# Patient Record
Sex: Female | Born: 1945 | Race: White | Hispanic: No | Smoking: Current every day smoker
Health system: Southern US, Community
[De-identification: ages and names within clinical notes are randomized; demographics above are authoritative.]

## PROBLEM LIST (undated history)

## (undated) DIAGNOSIS — T8859XA Other complications of anesthesia, initial encounter: Secondary | ICD-10-CM

## (undated) DIAGNOSIS — I10 Essential (primary) hypertension: Secondary | ICD-10-CM

## (undated) DIAGNOSIS — F17200 Nicotine dependence, unspecified, uncomplicated: Secondary | ICD-10-CM

## (undated) DIAGNOSIS — T4145XA Adverse effect of unspecified anesthetic, initial encounter: Secondary | ICD-10-CM

## (undated) DIAGNOSIS — E785 Hyperlipidemia, unspecified: Principal | ICD-10-CM

## (undated) DIAGNOSIS — E039 Hypothyroidism, unspecified: Secondary | ICD-10-CM

## (undated) DIAGNOSIS — G43909 Migraine, unspecified, not intractable, without status migrainosus: Secondary | ICD-10-CM

## (undated) DIAGNOSIS — M75101 Unspecified rotator cuff tear or rupture of right shoulder, not specified as traumatic: Secondary | ICD-10-CM

## (undated) DIAGNOSIS — M549 Dorsalgia, unspecified: Secondary | ICD-10-CM

## (undated) DIAGNOSIS — K219 Gastro-esophageal reflux disease without esophagitis: Secondary | ICD-10-CM

## (undated) DIAGNOSIS — C801 Malignant (primary) neoplasm, unspecified: Secondary | ICD-10-CM

## (undated) DIAGNOSIS — L989 Disorder of the skin and subcutaneous tissue, unspecified: Secondary | ICD-10-CM

## (undated) DIAGNOSIS — Z720 Tobacco use: Secondary | ICD-10-CM

## (undated) DIAGNOSIS — M545 Low back pain: Secondary | ICD-10-CM

## (undated) HISTORY — PX: TONSILLECTOMY: SUR1361

## (undated) HISTORY — DX: Hyperlipidemia, unspecified: E78.5

## (undated) HISTORY — DX: Low back pain: M54.5

## (undated) HISTORY — DX: Nicotine dependence, unspecified, uncomplicated: F17.200

## (undated) HISTORY — DX: Disorder of the skin and subcutaneous tissue, unspecified: L98.9

## (undated) HISTORY — PX: BACK SURGERY: SHX140

## (undated) HISTORY — PX: CHOLECYSTECTOMY: SHX55

## (undated) HISTORY — DX: Essential (primary) hypertension: I10

## (undated) HISTORY — PX: COSMETIC SURGERY: SHX468

## (undated) HISTORY — PX: OTHER SURGICAL HISTORY: SHX169

## (undated) HISTORY — DX: Tobacco use: Z72.0

## (undated) HISTORY — PX: LAMINECTOMY: SHX219

## (undated) HISTORY — DX: Dorsalgia, unspecified: M54.9

## (undated) HISTORY — DX: Migraine, unspecified, not intractable, without status migrainosus: G43.909

---

## 1995-11-02 HISTORY — PX: OTHER SURGICAL HISTORY: SHX169

## 2013-03-29 NOTE — Progress Notes (Signed)
Need orders in EPIC please - pt coming for PST on THURS 04/05/13 - thank you

## 2013-04-02 ENCOUNTER — Encounter (HOSPITAL_COMMUNITY): Payer: Self-pay | Admitting: Pharmacy Technician

## 2013-04-02 NOTE — Progress Notes (Signed)
Need orders in EPIC for preop 04/05/13.  Surgery  04/11/13.  Thanks.

## 2013-04-05 ENCOUNTER — Other Ambulatory Visit (HOSPITAL_COMMUNITY): Payer: Self-pay | Admitting: Orthopedic Surgery

## 2013-04-05 ENCOUNTER — Encounter (HOSPITAL_COMMUNITY)
Admission: RE | Admit: 2013-04-05 | Discharge: 2013-04-05 | Disposition: A | Discharge status: Home / Self Care | Payer: Medicare Other | Source: Ambulatory Visit | Attending: Orthopedic Surgery | Admitting: Orthopedic Surgery

## 2013-04-05 ENCOUNTER — Encounter (HOSPITAL_COMMUNITY): Payer: Self-pay

## 2013-04-05 HISTORY — DX: Gastro-esophageal reflux disease without esophagitis: K21.9

## 2013-04-05 HISTORY — DX: Unspecified rotator cuff tear or rupture of right shoulder, not specified as traumatic: M75.101

## 2013-04-05 HISTORY — DX: Hypothyroidism, unspecified: E03.9

## 2013-04-05 HISTORY — DX: Malignant (primary) neoplasm, unspecified: C80.1

## 2013-04-05 LAB — COMPREHENSIVE METABOLIC PANEL
ALT: 21 U/L (ref 0–35)
AST: 20 U/L (ref 0–37)
Albumin: 3.8 g/dL (ref 3.5–5.2)
Alkaline Phosphatase: 67 U/L (ref 39–117)
BUN: 12 mg/dL (ref 6–23)
CO2: 28 mEq/L (ref 19–32)
Calcium: 9.5 mg/dL (ref 8.4–10.5)
Chloride: 101 mEq/L (ref 96–112)
Creatinine, Ser: 0.66 mg/dL (ref 0.50–1.10)
GFR calc Af Amer: >90 mL/min (ref >90)
GFR calc non Af Amer: >90 mL/min (ref >90)
Glucose, Bld: 86 mg/dL (ref 70–99)
Potassium: 3.7 mEq/L (ref 3.5–5.1)
Sodium: 137 mEq/L (ref 135–145)
Total Bilirubin: 0.5 mg/dL (ref 0.3–1.2)
Total Protein: 7.2 g/dL (ref 6.0–8.3)

## 2013-04-05 LAB — URINE MICROSCOPIC-ADD ON

## 2013-04-05 LAB — URINALYSIS, ROUTINE W REFLEX MICROSCOPIC
Bilirubin Urine: NEGATIVE
Glucose, UA: NEGATIVE mg/dL
Ketones, ur: NEGATIVE mg/dL
Leukocytes, UA: NEGATIVE
Nitrite: NEGATIVE
Protein, ur: NEGATIVE mg/dL
Specific Gravity, Urine: 1.013 (ref 1.005–1.030)
Urobilinogen, UA: 0.2 mg/dL (ref 0.0–1.0)
pH: 5 (ref 5.0–8.0)

## 2013-04-05 LAB — CBC
Platelets: 256 10*3/uL (ref 150–400)
RBC: 4.58 MIL/uL (ref 3.87–5.11)
RDW: 13.7 % (ref 11.5–15.5)
WBC: 8.1 10*3/uL (ref 4.0–10.5)

## 2013-04-05 LAB — PROTIME-INR
INR: 1.03 (ref 0.00–1.49)
Prothrombin Time: 13.4 seconds (ref 11.6–15.2)

## 2013-04-05 LAB — APTT: aPTT: 31 seconds (ref 24–37)

## 2013-04-05 LAB — SURGICAL PCR SCREEN: MRSA, PCR: NEGATIVE

## 2013-04-05 NOTE — Patient Instructions (Addendum)
20 Wren Pryce  04/05/2013   Your procedure is scheduled on: 04-11-2013  Report to [facility] at 1100 AM.  Call this number if you have problems the morning of surgery [phone]   Remember:   Do not eat food :After Midnight.   clear liquids midnight until 730 am, then nothing by mouth   Take these medicines the morning of surgery with A SIP OF WATER: synthroid                                SEE [place] PREPARING FOR SURGERY SHEET   Do not wear jewelry, make-up or nail polish.  Do not wear lotions, powders, or perfumes. You may wear deodorant.   Men may shave face and neck.  Do not bring valuables to the hospital.  Contacts, dentures or bridgework may not be worn into surgery.  Leave suitcase in the car. After surgery it may be brought to your room.  For patients admitted to the hospital, checkout time is 11:00 AM the day of discharge.   Patients discharged the day of surgery will not be allowed to drive home.  Name and phone number of your driver:  Special Instructions: N/A   Please read over the following fact sheets that you were given: MRSA Information.  Call Cain Sieve RN pre op nurse if needed [phone]    FAILURE TO FOLLOW THESE INSTRUCTIONS MAY RESULT IN THE CANCELLATION OF YOUR SURGERY. PATIENT SIGNATURE___________________________________________

## 2013-04-05 NOTE — Progress Notes (Signed)
Micro UA results sent to Dr. Darrelyn Hillock via epic

## 2013-04-11 ENCOUNTER — Encounter (HOSPITAL_COMMUNITY)
Admission: RE | Disposition: A | Discharge status: Home / Self Care | Payer: Self-pay | Source: Ambulatory Visit | Attending: Orthopedic Surgery

## 2013-04-11 ENCOUNTER — Ambulatory Visit (HOSPITAL_COMMUNITY): Payer: Medicare Other | Admitting: Anesthesiology

## 2013-04-11 ENCOUNTER — Observation Stay (HOSPITAL_COMMUNITY)
Admission: RE | Admit: 2013-04-11 | Discharge: 2013-04-12 | Disposition: A | Discharge status: Home / Self Care | Payer: Medicare Other | Source: Ambulatory Visit | Attending: Orthopedic Surgery | Admitting: Orthopedic Surgery

## 2013-04-11 ENCOUNTER — Encounter (HOSPITAL_COMMUNITY): Payer: Self-pay | Admitting: Anesthesiology

## 2013-04-11 ENCOUNTER — Encounter (HOSPITAL_COMMUNITY): Payer: Self-pay | Admitting: *Deleted

## 2013-04-11 DIAGNOSIS — F17200 Nicotine dependence, unspecified, uncomplicated: Secondary | ICD-10-CM | POA: Insufficient documentation

## 2013-04-11 DIAGNOSIS — Z79899 Other long term (current) drug therapy: Secondary | ICD-10-CM | POA: Insufficient documentation

## 2013-04-11 DIAGNOSIS — Z7982 Long term (current) use of aspirin: Secondary | ICD-10-CM | POA: Insufficient documentation

## 2013-04-11 DIAGNOSIS — M75120 Complete rotator cuff tear or rupture of unspecified shoulder, not specified as traumatic: Principal | ICD-10-CM | POA: Insufficient documentation

## 2013-04-11 DIAGNOSIS — E039 Hypothyroidism, unspecified: Secondary | ICD-10-CM | POA: Insufficient documentation

## 2013-04-11 DIAGNOSIS — Z01812 Encounter for preprocedural laboratory examination: Secondary | ICD-10-CM | POA: Insufficient documentation

## 2013-04-11 DIAGNOSIS — K219 Gastro-esophageal reflux disease without esophagitis: Secondary | ICD-10-CM | POA: Insufficient documentation

## 2013-04-11 DIAGNOSIS — Z01810 Encounter for preprocedural cardiovascular examination: Secondary | ICD-10-CM | POA: Insufficient documentation

## 2013-04-11 DIAGNOSIS — M25819 Other specified joint disorders, unspecified shoulder: Secondary | ICD-10-CM | POA: Insufficient documentation

## 2013-04-11 DIAGNOSIS — Z85828 Personal history of other malignant neoplasm of skin: Secondary | ICD-10-CM | POA: Insufficient documentation

## 2013-04-11 DIAGNOSIS — M75101 Unspecified rotator cuff tear or rupture of right shoulder, not specified as traumatic: Secondary | ICD-10-CM

## 2013-04-11 DIAGNOSIS — M75100 Unspecified rotator cuff tear or rupture of unspecified shoulder, not specified as traumatic: Secondary | ICD-10-CM | POA: Diagnosis present

## 2013-04-11 DIAGNOSIS — IMO0002 Reserved for concepts with insufficient information to code with codable children: Secondary | ICD-10-CM | POA: Insufficient documentation

## 2013-04-11 DIAGNOSIS — E7800 Pure hypercholesterolemia, unspecified: Secondary | ICD-10-CM | POA: Insufficient documentation

## 2013-04-11 HISTORY — PX: SHOULDER OPEN ROTATOR CUFF REPAIR: SHX2407

## 2013-04-11 SURGERY — REPAIR, ROTATOR CUFF, OPEN
Anesthesia: General | Site: Shoulder | Laterality: Right | Wound class: Clean

## 2013-04-11 MED ORDER — DEXAMETHASONE SODIUM PHOSPHATE 10 MG/ML IJ SOLN
INTRAMUSCULAR | Status: DC | PRN
  Administered 2013-04-11: 10 mg via INTRAVENOUS

## 2013-04-11 MED ORDER — FLEET ENEMA 7-19 GM/118ML RE ENEM: 1.0000 | ENEMA | Freq: Once | RECTAL | Status: AC | PRN

## 2013-04-11 MED ORDER — PHENYLEPHRINE HCL 10 MG/ML IJ SOLN
INTRAMUSCULAR | Status: DC | PRN
  Administered 2013-04-11 (×4): 40 ug via INTRAVENOUS

## 2013-04-11 MED ORDER — SUCCINYLCHOLINE CHLORIDE 20 MG/ML IJ SOLN
INTRAMUSCULAR | Status: DC | PRN
  Administered 2013-04-11: 100 mg via INTRAVENOUS

## 2013-04-11 MED ORDER — HYDROMORPHONE HCL PF 1 MG/ML IJ SOLN: 0.5000 mg | INTRAMUSCULAR | Status: DC | PRN

## 2013-04-11 MED ORDER — METHOCARBAMOL 500 MG PO TABS
500.0000 mg | ORAL_TABLET | Freq: Four times a day (QID) | ORAL | Status: DC | PRN
  Administered 2013-04-11: 500 mg via ORAL
  Filled 2013-04-11 (×2): qty 1

## 2013-04-11 MED ORDER — ACETAMINOPHEN 325 MG PO TABS: 650.0000 mg | ORAL_TABLET | Freq: Four times a day (QID) | ORAL | Status: DC | PRN

## 2013-04-11 MED ORDER — SODIUM CHLORIDE 0.9 % IR SOLN
Status: DC | PRN
  Administered 2013-04-11: 14:00:00

## 2013-04-11 MED ORDER — MENTHOL 3 MG MT LOZG: 1.0000 | LOZENGE | OROMUCOSAL | Status: DC | PRN

## 2013-04-11 MED ORDER — THROMBIN 5000 UNITS EX SOLR
CUTANEOUS | Status: AC
  Filled 2013-04-11: qty 10000

## 2013-04-11 MED ORDER — MIDAZOLAM HCL 5 MG/5ML IJ SOLN
INTRAMUSCULAR | Status: DC | PRN
  Administered 2013-04-11: 2 mg via INTRAVENOUS

## 2013-04-11 MED ORDER — PROPOFOL 10 MG/ML IV BOLUS
INTRAVENOUS | Status: DC | PRN
  Administered 2013-04-11: 100 mg via INTRAVENOUS
  Administered 2013-04-11: 30 mg via INTRAVENOUS

## 2013-04-11 MED ORDER — BISACODYL 10 MG RE SUPP: 10.0000 mg | Freq: Every day | RECTAL | Status: DC | PRN

## 2013-04-11 MED ORDER — HYDROMORPHONE HCL PF 1 MG/ML IJ SOLN
0.2500 mg | INTRAMUSCULAR | Status: DC | PRN
  Administered 2013-04-11 (×3): 0.5 mg via INTRAVENOUS

## 2013-04-11 MED ORDER — OXYCODONE-ACETAMINOPHEN 5-325 MG PO TABS
1.0000 | ORAL_TABLET | ORAL | Status: DC | PRN
  Administered 2013-04-11 – 2013-04-12 (×3): 1 via ORAL
  Filled 2013-04-11 (×4): qty 1

## 2013-04-11 MED ORDER — HYDROMORPHONE HCL PF 1 MG/ML IJ SOLN
INTRAMUSCULAR | Status: AC
  Filled 2013-04-11: qty 1

## 2013-04-11 MED ORDER — ONDANSETRON HCL 4 MG/2ML IJ SOLN
INTRAMUSCULAR | Status: DC | PRN
  Administered 2013-04-11: 4 mg via INTRAVENOUS

## 2013-04-11 MED ORDER — LACTATED RINGERS IV SOLN
INTRAVENOUS | Status: DC
  Administered 2013-04-11: 13:00:00 via INTRAVENOUS

## 2013-04-11 MED ORDER — FENTANYL CITRATE 0.05 MG/ML IJ SOLN
INTRAMUSCULAR | Status: DC | PRN
  Administered 2013-04-11: 100 ug via INTRAVENOUS
  Administered 2013-04-11 (×3): 50 ug via INTRAVENOUS

## 2013-04-11 MED ORDER — BUPIVACAINE LIPOSOME 1.3 % IJ SUSP
INTRAMUSCULAR | Status: DC | PRN
  Administered 2013-04-11: 20 mL

## 2013-04-11 MED ORDER — ONDANSETRON HCL 4 MG PO TABS: 4.0000 mg | ORAL_TABLET | Freq: Four times a day (QID) | ORAL | Status: DC | PRN

## 2013-04-11 MED ORDER — CLINDAMYCIN PHOSPHATE 900 MG/50ML IV SOLN
INTRAVENOUS | Status: AC
  Filled 2013-04-11: qty 50

## 2013-04-11 MED ORDER — PROMETHAZINE HCL 25 MG/ML IJ SOLN: 6.2500 mg | INTRAMUSCULAR | Status: DC | PRN

## 2013-04-11 MED ORDER — POLYETHYLENE GLYCOL 3350 17 G PO PACK: 17.0000 g | PACK | Freq: Every day | ORAL | Status: DC | PRN

## 2013-04-11 MED ORDER — ACETAMINOPHEN 650 MG RE SUPP: 650.0000 mg | Freq: Four times a day (QID) | RECTAL | Status: DC | PRN

## 2013-04-11 MED ORDER — BUPIVACAINE LIPOSOME 1.3 % IJ SUSP
20.0000 mL | Freq: Once | INTRAMUSCULAR | Status: DC
  Filled 2013-04-11: qty 20

## 2013-04-11 MED ORDER — PHENOL 1.4 % MT LIQD: 1.0000 | OROMUCOSAL | Status: DC | PRN

## 2013-04-11 MED ORDER — ATORVASTATIN CALCIUM 80 MG PO TABS
80.0000 mg | ORAL_TABLET | Freq: Every day | ORAL | Status: DC
  Administered 2013-04-11: 80 mg via ORAL
  Filled 2013-04-11 (×2): qty 1

## 2013-04-11 MED ORDER — CLINDAMYCIN PHOSPHATE 900 MG/50ML IV SOLN
900.0000 mg | INTRAVENOUS | Status: AC
  Administered 2013-04-11: 900 mg via INTRAVENOUS
  Filled 2013-04-11: qty 50

## 2013-04-11 MED ORDER — ASPIRIN EC 81 MG PO TBEC
81.0000 mg | DELAYED_RELEASE_TABLET | Freq: Every day | ORAL | Status: DC
  Administered 2013-04-11 – 2013-04-12 (×2): 81 mg via ORAL
  Filled 2013-04-11 (×2): qty 1

## 2013-04-11 MED ORDER — PANTOPRAZOLE SODIUM 20 MG PO TBEC
20.0000 mg | DELAYED_RELEASE_TABLET | Freq: Every day | ORAL | Status: DC
  Administered 2013-04-11: 20 mg via ORAL
  Filled 2013-04-11 (×2): qty 1

## 2013-04-11 MED ORDER — LEVOTHYROXINE SODIUM 50 MCG PO TABS
50.0000 ug | ORAL_TABLET | Freq: Every day | ORAL | Status: DC
  Administered 2013-04-12: 50 ug via ORAL
  Filled 2013-04-11 (×2): qty 1

## 2013-04-11 MED ORDER — LACTATED RINGERS IV SOLN: INTRAVENOUS | Status: DC

## 2013-04-11 MED ORDER — NICOTINE 7 MG/24HR TD PT24
7.0000 mg | MEDICATED_PATCH | Freq: Every day | TRANSDERMAL | Status: DC
Start: 2013-04-11 — End: 2013-04-11
  Administered 2013-04-11: 7 mg via TRANSDERMAL
  Filled 2013-04-11 (×2): qty 1

## 2013-04-11 MED ORDER — ONDANSETRON HCL 4 MG/2ML IJ SOLN: 4.0000 mg | Freq: Four times a day (QID) | INTRAMUSCULAR | Status: DC | PRN

## 2013-04-11 MED ORDER — METHOCARBAMOL 100 MG/ML IJ SOLN
500.0000 mg | Freq: Four times a day (QID) | INTRAMUSCULAR | Status: DC | PRN
  Administered 2013-04-11: 500 mg via INTRAVENOUS
  Filled 2013-04-11: qty 5

## 2013-04-11 MED ORDER — LACTATED RINGERS IV SOLN
INTRAVENOUS | Status: DC
  Administered 2013-04-11: 1000 mL via INTRAVENOUS

## 2013-04-11 MED ORDER — CLINDAMYCIN PHOSPHATE 600 MG/50ML IV SOLN
600.0000 mg | Freq: Four times a day (QID) | INTRAVENOUS | Status: AC
  Administered 2013-04-11 – 2013-04-12 (×3): 600 mg via INTRAVENOUS
  Filled 2013-04-11 (×3): qty 50

## 2013-04-11 MED ORDER — CEFAZOLIN SODIUM-DEXTROSE 2-3 GM-% IV SOLR
INTRAVENOUS | Status: AC
  Filled 2013-04-11: qty 50

## 2013-04-11 MED ORDER — LIDOCAINE HCL (CARDIAC) 20 MG/ML IV SOLN
INTRAVENOUS | Status: DC | PRN
  Administered 2013-04-11: 80 mg via INTRAVENOUS

## 2013-04-11 SURGICAL SUPPLY — 45 items
BAG ZIPLOCK 12X15 (MISCELLANEOUS) ×2 IMPLANT
BLADE OSCILLATING/SAGITTAL (BLADE) ×1
BLADE SW THK.38XMED LNG THN (BLADE) ×1 IMPLANT
BNDG COHESIVE 6X5 TAN NS LF (GAUZE/BANDAGES/DRESSINGS) IMPLANT
BUR OVAL CARBIDE 4.0 (BURR) ×2 IMPLANT
CLEANER TIP ELECTROSURG 2X2 (MISCELLANEOUS) ×2 IMPLANT
CLOTH BEACON ORANGE TIMEOUT ST (SAFETY) ×2 IMPLANT
DERMABOND ADVANCED (GAUZE/BANDAGES/DRESSINGS) ×1
DERMABOND ADVANCED .7 DNX12 (GAUZE/BANDAGES/DRESSINGS) ×1 IMPLANT
DRAPE POUCH INSTRU U-SHP 10X18 (DRAPES) ×2 IMPLANT
DRSG AQUACEL AG ADV 3.5X 6 (GAUZE/BANDAGES/DRESSINGS) ×2 IMPLANT
DRSG EMULSION OIL 3X3 NADH (GAUZE/BANDAGES/DRESSINGS) ×2 IMPLANT
DRSG PAD ABDOMINAL 8X10 ST (GAUZE/BANDAGES/DRESSINGS) ×4 IMPLANT
DURAPREP 26ML APPLICATOR (WOUND CARE) ×2 IMPLANT
ELECT REM PT RETURN 9FT ADLT (ELECTROSURGICAL) ×2
ELECTRODE REM PT RTRN 9FT ADLT (ELECTROSURGICAL) ×1 IMPLANT
GLOVE BIOGEL PI IND STRL 8 (GLOVE) ×1 IMPLANT
GLOVE BIOGEL PI INDICATOR 8 (GLOVE) ×1
GLOVE ECLIPSE 8.0 STRL XLNG CF (GLOVE) ×4 IMPLANT
GLOVE SURG SS PI 6.5 STRL IVOR (GLOVE) ×4 IMPLANT
GOWN PREVENTION PLUS LG XLONG (DISPOSABLE) ×4 IMPLANT
KIT BASIN OR (CUSTOM PROCEDURE TRAY) ×2 IMPLANT
MANIFOLD NEPTUNE II (INSTRUMENTS) ×2 IMPLANT
NEEDLE MA TROC 1/2 (NEEDLE) IMPLANT
NS IRRIG 1000ML POUR BTL (IV SOLUTION) IMPLANT
PACK SHOULDER CUSTOM OPM052 (CUSTOM PROCEDURE TRAY) ×2 IMPLANT
PASSER SUT SWANSON 36MM LOOP (INSTRUMENTS) IMPLANT
PATCH TISSUE MEND 3X3CM (Orthopedic Implant) ×2 IMPLANT
POSITIONER SURGICAL ARM (MISCELLANEOUS) ×2 IMPLANT
SLING ARM IMMOBILIZER LRG (SOFTGOODS) ×2 IMPLANT
SPONGE SURGIFOAM ABS GEL 100 (HEMOSTASIS) IMPLANT
STAPLER VISISTAT 35W (STAPLE) ×2 IMPLANT
STRIP CLOSURE SKIN 1/2X4 (GAUZE/BANDAGES/DRESSINGS) ×2 IMPLANT
SUCTION FRAZIER 12FR DISP (SUCTIONS) ×2 IMPLANT
SUT BONE WAX W31G (SUTURE) ×2 IMPLANT
SUT ETHIBOND NAB CT1 #1 30IN (SUTURE) ×2 IMPLANT
SUT MNCRL AB 4-0 PS2 18 (SUTURE) ×2 IMPLANT
SUT VIC AB 0 CT1 27 (SUTURE) ×1
SUT VIC AB 0 CT1 27XBRD ANTBC (SUTURE) ×1 IMPLANT
SUT VIC AB 1 CT1 27 (SUTURE) ×2
SUT VIC AB 1 CT1 27XBRD ANTBC (SUTURE) ×2 IMPLANT
SUT VIC AB 2-0 CT1 27 (SUTURE) ×2
SUT VIC AB 2-0 CT1 27XBRD (SUTURE) IMPLANT
SUT VIC AB 2-0 CT1 TAPERPNT 27 (SUTURE) ×2 IMPLANT
TOWEL OR 17X26 10 PK STRL BLUE (TOWEL DISPOSABLE) ×4 IMPLANT

## 2013-04-11 NOTE — H&P (Signed)
Carrie Drake DOB: Nov 20, 1945  Chief Complaint: right shoulder pain   History of Present Illness The patient is a 67 year old female who presents with shoulder complaints. They are right handed and presented reporting pain and throbbing at the right shoulder that began 3 months ago. The patient reports that the shoulder symptoms began without any known injury, other than recently moving here and states that she has been moving a lot of heavy furniture around. The patient reports symptoms which include shoulder pain, tenderness, night pain, inability to lay on that side, numbness and tingling in fingers depending on how much she uses her shoulder and neck pain. The patient reports that these symptoms are present constantly. The patient describes these symptoms as severe and worsening. Symptoms are exacerbated by motion at the shoulder and lifting. Symptoms are not relieved by application of ice. The patient was previously evaluated [facility]. Past evaluation has included shoulder MRI which revealed a tear of her right rotator cuff. She has had a cortisone injection with little relief.   Allergies Sulfa Drugs Penicillins Morphine Derivatives Tetracyclines   Family History Cancer. father Cerebrovascular Accident. father and brother Chronic Obstructive Lung Disease. father Congestive Heart Failure. father Diabetes Mellitus. father and brother Drug / Alcohol Addiction. father Heart Disease. mother, father and brother Heart disease in female family member before age 45 Hypertension. father Rheumatoid Arthritis. mother   Social History Alcohol use. current drinker; drinks wine; less than 5 per week Children. 2 Current work status. retired Exercise. Exercises weekly; does running / walking Living situation. live alone Marital status. divorced Number of flights of stairs before winded. greater than 5 Tobacco use. current every day smoker;  smoke(d) less than 1/2 pack(s) per day   Medication History Synthroid ( Tablet, Oral) Active. Aspirin Low Strength (81MG  Tablet Chewable, Oral) Active. Lipitor (80MG  Tablet, Oral) Active. Calcium Ascorbate (500MG  Tablet, Oral) Active.   Past Surgical History Breast Biopsy. left Foot Surgery. left Gallbladder Surgery. laporoscopic Neck Disc Surgery Spinal Decompression. lower back Spinal Fusion. neck Tonsillectomy   Past Medical History Allergic Urticaria Gastroesophageal Reflux Disease Hypercholesterolemia Hypothyroidism Migraine Headache Skin Cancer Ulcer disease   Review of Systems General:Not Present- Chills, Fever, Night Sweats, Appetite Loss, Fatigue, Feeling sick, Weight Gain and Weight Loss. Skin:Not Present- Itching, Rash, Skin Color Changes, Ulcer, Psoriasis and Change in Hair or Nails. HEENT:Not Present- Sensitivity to light, Hearing problems, Nose Bleed and Ringing in the Ears. Neck:Not Present- Swollen Glands and Neck Mass. Respiratory:Not Present- Snoring, Chronic Cough, Bloody sputum and Dyspnea. Cardiovascular:Present- Leg Cramps. Not Present- Shortness of Breath, Chest Pain, Swelling of Extremities and Palpitations. Gastrointestinal:Present- Heartburn. Not Present- Bloody Stool, Abdominal Pain, Vomiting, Nausea and Incontinence of Stool. Female Genitourinary:Not Present- Blood in Urine, Menstrual Irregularities, Frequency, Incontinence and Nocturia. Musculoskeletal:Present- Joint Stiffness, Joint Swelling, Joint Pain and Back Pain. Not Present- Muscle Weakness and Muscle Pain. Neurological:Not Present- Tingling, Numbness, Burning, Tremor, Headaches and Dizziness. Psychiatric:Not Present- Anxiety, Depression and Memory Loss. Endocrine:Not Present- Cold Intolerance, Heat Intolerance, Excessive hunger and Excessive Thirst. Hematology:Not Present- Abnormal Bleeding, Anemia, Blood Clots and Easy Bruising.   Vitals Weight: 123  lb Height: 58.5 in Body Surface Area: 1.52 m Body Mass Index: 25.27 kg/m Pulse: 89 (Regular) BP: 150/81 (Sitting, Left Arm, Standard)   Physical Exam Alert and oriented and in no acute distress. Heart sounds normal. RRR. No murmurs. Lungs clear to auscultation. Abdomen soft and nontender. Bowel sounds active. EOM intact. Neck supple, no bruit. On examination of her right shoulder  she is unable to abduct her shoulder beyond about 30 degrees. She had severe pain with abduction. She has no masses or lymphadenopathy in the axilla. There are no supraclavicular nodes. The [facility] joint is intact. The shoulder is well located. The shoulder function is limited in all ranges, due to her pain. There are no major neck issues. She has good circulation in her hand. Good function in her hand.    RADIOGRAPHS: Plain X-rays of her shoulder shows the humeral head is riding high in her glenoid.    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 She needs to have an open repair and possible graft as we described today. Possible use of graft and anchors. She will be in the hospital overnight. She is not permitted to eat or drink after midnight the night before. Possible complications are infection, etc, which are extremely rare.     Dimitri Ped, PA-C

## 2013-04-11 NOTE — Interval H&P Note (Signed)
History and Physical Interval Note:  04/11/2013 12:52 PM  Carrie Drake  has presented today for surgery, with the diagnosis of RIGHT TORN ROTATOR CUFF  The various methods of treatment have been discussed with the patient and family. After consideration of risks, benefits and other options for treatment, the patient has consented to  Procedure(s): RIGHT ROTATOR CUFF REPAIR SHOULDER OPEN (Right) as a surgical intervention .  The patient's history has been reviewed, patient examined, no change in status, stable for surgery.  I have reviewed the patient's chart and labs.  Questions were answered to the patient's satisfaction.     Djimon Lundstrom A

## 2013-04-11 NOTE — Transfer of Care (Signed)
Immediate Anesthesia Transfer of Care Note  Patient: Carrie Drake  Procedure(s) Performed: Procedure(s): RIGHT ROTATOR CUFF REPAIR SHOULDER OPEN, ACROMIONECTOMY w/ GRAFT (Right)  Patient Location: PACU  Anesthesia Type:General  Level of Consciousness: awake, alert , oriented and patient cooperative  Airway & Oxygen Therapy: Patient Spontanous Breathing and Patient connected to face mask oxygen  Post-op Assessment: Report given to PACU RN and Post -op Vital signs reviewed and stable  Post vital signs: stable  Complications: No apparent anesthesia complications

## 2013-04-11 NOTE — Anesthesia Preprocedure Evaluation (Addendum)
Anesthesia Evaluation  Patient identified by MRN, date of birth, ID band Patient awake    Reviewed: Allergy & Precautions, H&P , NPO status , Patient's Chart, lab work & pertinent test results  Airway Mallampati: II TM Distance: >3 FB Neck ROM: Full    Dental no notable dental hx.    Pulmonary Current Smoker,  breath sounds clear to auscultation  Pulmonary exam normal       Cardiovascular Exercise Tolerance: Good negative cardio ROS  Rhythm:Regular Rate:Normal     Neuro/Psych Neck, lower back, and shoulder pain.  Neuromuscular disease negative psych ROS   GI/Hepatic Neg liver ROS, GERD-  Medicated,  Endo/Other  Hypothyroidism   Renal/GU negative Renal ROS  negative genitourinary   Musculoskeletal negative musculoskeletal ROS (+)   Abdominal   Peds negative pediatric ROS (+)  Hematology negative hematology ROS (+)   Anesthesia Other Findings   Reproductive/Obstetrics negative OB ROS                          Anesthesia Physical Anesthesia Plan  ASA: II  Anesthesia Plan: General   Post-op Pain Management:    Induction: Intravenous  Airway Management Planned: Oral ETT  Additional Equipment:   Intra-op Plan:   Post-operative Plan: Extubation in OR  Informed Consent: I have reviewed the patients History and Physical, chart, labs and discussed the procedure including the risks, benefits and alternatives for the proposed anesthesia with the patient or authorized representative who has indicated his/her understanding and acceptance.   Dental advisory given  Plan Discussed with: CRNA  Anesthesia Plan Comments:         Anesthesia Quick Evaluation

## 2013-04-11 NOTE — Brief Op Note (Signed)
04/11/2013  2:37 PM  PATIENT:  Carrie Drake  67 y.o. female  PRE-OPERATIVE DIAGNOSIS:  RIGHT TORN ROTATOR CUFF  POST-OPERATIVE DIAGNOSIS:  RIGHT TORN ROTATOR CUFF  PROCEDURE:  Procedure(s): RIGHT ROTATOR CUFF REPAIR SHOULDER OPEN, ACROMIONECTOMY w/ GRAFT (Right)  SURGEON:  Surgeon(s) and Role:    * Jacki Cones, MD - Primary  PHYSICIAN ASSISTANT: Dimitri Ped PA  ASSISTANTS: Dimitri Ped PA   ANESTHESIA:   general  EBL:  Total I/O In: 1000 [I.V.:1000] Out: -   BLOOD ADMINISTERED:none  DRAINS: none   LOCAL MEDICATIONS USED:  BUPIVICAINE 20cc  SPECIMEN:  No Specimen  DISPOSITION OF SPECIMEN:  N/A  COUNTS:  YES  TOURNIQUET:  * No tourniquets in log *  DICTATION: .Other Dictation: Dictation Number [id number]  PLAN OF CARE: Admit for overnight observation  PATIENT DISPOSITION:  Stable In OR   Delay start of Pharmacological VTE agent (>24hrs) due to surgical blood loss or risk of bleeding: yes

## 2013-04-11 NOTE — H&P (View-Only) (Signed)
Need orders in EPIC for preop 04/05/13.  Surgery  04/11/13.  Thanks.  

## 2013-04-12 ENCOUNTER — Encounter (HOSPITAL_COMMUNITY): Payer: Self-pay | Admitting: Orthopedic Surgery

## 2013-04-12 MED ORDER — METHOCARBAMOL 500 MG PO TABS: 500.0000 mg | ORAL_TABLET | Freq: Four times a day (QID) | ORAL | Status: DC

## 2013-04-12 MED ORDER — OXYCODONE-ACETAMINOPHEN 10-325 MG PO TABS: 1.0000 | ORAL_TABLET | ORAL | Status: DC | PRN

## 2013-04-12 NOTE — Op Note (Signed)
NAMEYATZIRI, WAINWRIGHT            ACCOUNT NO.:  [id number]  MEDICAL RECORD NO.:  [id number]  LOCATION:  1617                         FACILITY:  [facility]  PHYSICIAN:  Georges Lynch. Frantz Quattrone, M.D.DATE OF BIRTH:  10-27-1946  DATE OF PROCEDURE:  04/11/2013 DATE OF DISCHARGE:                              OPERATIVE REPORT   SURGEON:  Georges Lynch. Zayan Delvecchio, M.D.  ASSISTANT:  Dimitri Ped, PA-C  PREOPERATIVE DIAGNOSES: 1. Severe impingement syndrome, right shoulder. 2. Torn rotator cuff tendon, right shoulder. 3. Subdeltoid bursitis.  POSTOPERATIVE DIAGNOSES: 1. Severe impingement syndrome, right shoulder. 2. Torn rotator cuff tendon, right shoulder. 3. Subdeltoid bursitis.  OPERATION: 1. Open acromionectomy and acromioplasty, right shoulder. 2. Repair of rotator cuff tendon, right shoulder utilizing a     TissueMend graft. 3. Bursectomy of the subdeltoid bursa, right shoulder.  PROCEDURE:  Under general anesthesia, routine orthopedic prep and drape was carried out with the patient in a beach chair position.  The appropriate time-out was first carried out.  I also marked the appropriate right arm in the holding area.  At this time, an incision was made over the acromion on the right shoulder which was identified and cauterized.  The incision was extended slightly distally into the deltoid muscle.  At this time, I stripped the deltoid tendon from the acromion in the usual fashion.  I then identified a severe impingement- type problem within the acromion.  I protected the underlying rotator cuff with the Bennett retractor, utilized the oscillating saw and burred a partial acromionectomy and acromioplasty.  I then thoroughly irrigated out the area.  I had bone waxed the undersurface of the acromion.  Note, the tendon literally was about 50% torn.  We just simply did a simple repair with utilizing a TissueMend graft.  No anchor was necessary. When we were finished, we made sure that we had a  good re-establishment of the subacromial space.  Thoroughly irrigated out the area, reapproximated the deltoid tendon muscle in usual fashion.  The subcu closure was carried out, utilizing 2-0 Monocryl.  Prior to closing the bone, I injected 20 mL of Exparel into the wound site.  Sterile dressings were applied.  The patient was placed in a sling.  She had Cleocin 900 mg.          ______________________________ Georges Lynch. Darrelyn Hillock, M.D.     RAG/MEDQ  D:  04/11/2013  T:  04/12/2013  Job:  [id number]

## 2013-04-12 NOTE — Evaluation (Signed)
Occupational Therapy Evaluation Patient Details Name: Carrie Drake MRN: [id number] DOB: 05-19-1946 Today's Date: 04/12/2013 Time: 4098-1191 and 1010-1015 OT Time Calculation (min): 53 min  OT Assessment / Plan / Recommendation Clinical Impression  This 67 year old female was admitted for R RCR.  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  Took photos of back of immobilizer.  She will follow up with Dr Darrelyn Hillock for further rehab.      OT Assessment  Patient does not need any further OT services    Follow Up Recommendations   (follow up with Dr Darrelyn Hillock)    Barriers to Discharge      Equipment Recommendations  None recommended by OT    Recommendations for Other Services    Frequency       Precautions / Restrictions Precautions Precautions: Shoulder Type of Shoulder Precautions: sling at all times except for bathing dressing Precaution Booklet Issued: Yes (comment) Restrictions Weight Bearing Restrictions: Yes Other Position/Activity Restrictions: NWB   Pertinent Vitals/Pain Pain OK.  Repositioned with ice    ADL  Upper Body Bathing: Moderate assistance Where Assessed - Upper Body Bathing: Unsupported sitting Upper Body Dressing: Maximal assistance Where Assessed - Upper Body Dressing: Unsupported sitting Toilet Transfer: Simulated;Supervision/safety (bed to chair) Transfers/Ambulation Related to ADLs: spt to chair only ADL Comments: Reviewed shoulder protocol and pt verbalizes.  Switching sling size:  ortho tech brought it in.  Will go back when IV is disconnected.  Pt verbalizes all education.  Pt has an overhead shirt; will switch to button down once home.   Pt does need cues not to use R arm as she automatically tries to position/help during adls.  Verbalizes understanding.  Recommended that she hold something light such as a small ball so that she doesn't use it.    Returned to don new sling:  Was not immobilizer so ortho tech will switch out.   Placed overhead shirt over sling (stretchy)   OT Diagnosis:    OT Problem List:   OT Treatment Interventions:     OT Goals    Visit Information  Last OT Received On: 04/12/13 Assistance Needed: +1    Subjective Data  Subjective: My sister will help me Patient Stated Goal: have a good recovery and not let shoulder freeze   Prior Functioning     Home Living Lives With: Alone Bathroom Shower/Tub: Health visitor: Standard Prior Function Level of Independence: Independent Vocation: Retired Banker) Communication Communication: No difficulties Dominant Hand: Right         Vision/Perception     Cognition  Cognition Arousal/Alertness: Awake/alert Behavior During Therapy: WFL for tasks assessed/performed Overall Cognitive Status: Within Functional Limits for tasks assessed    Extremity/Trunk Assessment Right Upper Extremity Assessment RUE ROM/Strength/Tone: Unable to fully assess;Due to precautions Left Upper Extremity Assessment LUE ROM/Strength/Tone: WFL for tasks assessed     Mobility Bed Mobility Bed Mobility: Supine to Sit Supine to Sit: 7: Independent Transfers Transfers: Sit to Stand Sit to Stand: 7: Independent     Exercise Donning/doffing shirt without moving shoulder: Patient able to independently direct caregiver Method for sponge bathing under operated UE: Patient able to independently direct caregiver Donning/doffing sling/immobilizer: Patient able to independently direct caregiver Correct positioning of sling/immobilizer: Patient able to independently direct caregiver Sling wearing schedule (on at all times/off for ADL's): Patient able to independently direct caregiver Proper positioning of operated UE when showering: Patient able to independently direct caregiver Positioning of UE  while sleeping: Patient able to independently direct caregiver   Balance     End of Session OT - End of Session Activity Tolerance: Patient tolerated  treatment well Patient left: in chair;with call bell/phone within reach  GO Functional Assessment Tool Used: clinical observation Functional Limitation: Self care Self Care Current Status (Z6109): At least 60 percent but less than 80 percent impaired, limited or restricted Self Care Goal Status (U0454): At least 60 percent but less than 80 percent impaired, limited or restricted Self Care Discharge Status [phone]): At least 60 percent but less than 80 percent impaired, limited or restricted   [facility] 04/12/2013, 10:19 AM Marica Otter, OTR/L [phone] 04/12/2013

## 2013-04-12 NOTE — Progress Notes (Signed)
Subjective: 1 Day Post-Op Procedure(s) (LRB): RIGHT ROTATOR CUFF REPAIR SHOULDER OPEN, ACROMIONECTOMY w/ GRAFT (Right) Patient reports pain as 3 on 0-10 scale. Doing well. Will DC   Objective: Vital signs in last 24 hours: Temp:  [97.2 F (36.2 C)-98.4 F (36.9 C)] 98.4 F (36.9 C) (06/12 0625) Pulse Rate:  [79-89] 85 (06/12 0625) Resp:  [12-25] 16 (06/12 0625) BP: (109-160)/(59-88) 109/59 mmHg (06/12 0625) SpO2:  [93 %-100 %] 95 % (06/12 0625) Weight:  [55.157 kg (121 lb 9.6 oz)] 55.157 kg (121 lb 9.6 oz) (06/11 1545)  Intake/Output from previous day: 06/11 0701 - 06/12 0700 In: 2215 [P.O.:240; I.V.:1820; IV Piggyback:155] Out: 875 [Urine:775; Blood:100] Intake/Output this shift: Total I/O In: 720 [I.V.:620; IV Piggyback:100] Out: 775 [Urine:775]  No results found for this basename: HGB,  in the last 72 hours No results found for this basename: WBC, RBC, HCT, PLT,  in the last 72 hours No results found for this basename: NA, K, CL, CO2, BUN, CREATININE, GLUCOSE, CALCIUM,  in the last 72 hours No results found for this basename: LABPT, INR,  in the last 72 hours  Neurologically intact  Assessment/Plan: 1 Day Post-Op Procedure(s) (LRB): RIGHT ROTATOR CUFF REPAIR SHOULDER OPEN, ACROMIONECTOMY w/ GRAFT (Right) Discharge home with home health  Carrie Drake A 04/12/2013, 6:55 AM

## 2013-04-13 NOTE — Anesthesia Postprocedure Evaluation (Signed)
  Anesthesia Post-op Note  Patient: Carrie Drake  Procedure(s) Performed: Procedure(s) (LRB): RIGHT ROTATOR CUFF REPAIR SHOULDER OPEN, ACROMIONECTOMY w/ GRAFT (Right)  Patient Location: PACU  Anesthesia Type: General  Level of Consciousness: awake and alert   Airway and Oxygen Therapy: Patient Spontanous Breathing  Post-op Pain: mild  Post-op Assessment: Post-op Vital signs reviewed, Patient's Cardiovascular Status Stable, Respiratory Function Stable, Patent Airway and No signs of Nausea or vomiting  Last Vitals:  Filed Vitals:   04/12/13 1000  BP: 135/73  Pulse: 73  Temp: 36.6 C  Resp: 16    Post-op Vital Signs: stable   Complications: No apparent anesthesia complications

## 2013-04-18 NOTE — Discharge Summary (Signed)
Physician Discharge Summary   Patient ID: Carrie Drake MRN: [id number] DOB/AGE: September 07, 1946 67 y.o.  Admit date: 04/11/2013 Discharge date: 04/12/2013  Primary Diagnosis: Right shoulder, rotator cuff tear  Admission Diagnoses:  Past Medical History  Diagnosis Date  . Hypothyroidism   . 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 GERD (gastroesophageal reflux disease)   . Right rotator cuff tear    Discharge Diagnoses:   Active Problems:   Tear of rotator cuff  Estimated body mass index is 24.55 kg/(m^2) as calculated from the following:   Height as of this encounter: 4\' 11"  (1.499 m).   Weight as of this encounter: 55.157 kg (121 lb 9.6 oz).  Procedure:  Procedure(s) (LRB): RIGHT ROTATOR CUFF REPAIR SHOULDER OPEN, ACROMIONECTOMY w/ GRAFT (Right)   Consults: None  HPI: The patient is a 67 year old female who presents with shoulder complaints. They are right handed and presented reporting pain and throbbing at the right shoulder that began 3 months ago. The patient reports that the shoulder symptoms began without any known injury, other than recently moving here and states that she has been moving a lot of heavy furniture around. The patient reports symptoms which include shoulder pain, tenderness, night pain, inability to lay on that side, numbness and tingling in fingers depending on how much she uses her shoulder and neck pain. The patient reports that these symptoms are present constantly. The patient describes these symptoms as severe and worsening. Symptoms are exacerbated by motion at the shoulder and lifting. Symptoms are not relieved by application of ice. The patient was previously evaluated [facility] Past evaluation has included shoulder MRI which revealed a tear of her right rotator cuff. She has had a cortisone injection with little relief.    Laboratory Data: Hospital Outpatient Visit on 04/05/2013  Component Date Value Range Status  . aPTT 04/05/2013  31  24 - 37 seconds Final  . Sodium 04/05/2013 137  135 - 145 mEq/L Final  . Potassium 04/05/2013 3.7  3.5 - 5.1 mEq/L Final  . Chloride 04/05/2013 101  96 - 112 mEq/L Final  . CO2 04/05/2013 28  19 - 32 mEq/L Final  . Glucose, Bld 04/05/2013 86  70 - 99 mg/dL Final  . BUN 40/98/1191 12  6 - 23 mg/dL Final  . Creatinine, Ser 04/05/2013 0.66  0.50 - 1.10 mg/dL Final  . Calcium 47/82/9562 9.5  8.4 - 10.5 mg/dL Final  . Total Protein 04/05/2013 7.2  6.0 - 8.3 g/dL Final  . Albumin 13/06/6577 3.8  3.5 - 5.2 g/dL Final  . AST 46/96/2952 20  0 - 37 U/L Final  . ALT 04/05/2013 21  0 - 35 U/L Final  . Alkaline Phosphatase 04/05/2013 67  39 - 117 U/L Final  . Total Bilirubin 04/05/2013 0.5  0.3 - 1.2 mg/dL Final  . GFR calc non Af Amer 04/05/2013 >90  >90 mL/min Final  . GFR calc Af Amer 04/05/2013 >90  >90 mL/min Final   Comment:                                 The eGFR has been calculated                          using the CKD EPI equation.  This calculation has not been                          validated in all clinical                          situations.                          eGFR's persistently                          <90 mL/min signify                          possible Chronic Kidney Disease.  Marland Kitchen Prothrombin Time 04/05/2013 13.4  11.6 - 15.2 seconds Final  . INR 04/05/2013 1.03  0.00 - 1.49 Final  . Color, Urine 04/05/2013 YELLOW  YELLOW Final  . APPearance 04/05/2013 CLEAR  CLEAR Final  . Specific Gravity, Urine 04/05/2013 1.013  1.005 - 1.030 Final  . pH 04/05/2013 5.0  5.0 - 8.0 Final  . Glucose, UA 04/05/2013 NEGATIVE  NEGATIVE mg/dL Final  . Hgb urine dipstick 04/05/2013 SMALL* NEGATIVE Final  . Bilirubin Urine 04/05/2013 NEGATIVE  NEGATIVE Final  . Ketones, ur 04/05/2013 NEGATIVE  NEGATIVE mg/dL Final  . Protein, ur 16/08/9603 NEGATIVE  NEGATIVE mg/dL Final  . Urobilinogen, UA 04/05/2013 0.2  0.0 - 1.0 mg/dL Final  . Nitrite 54/07/8118 NEGATIVE   NEGATIVE Final  . Leukocytes, UA 04/05/2013 NEGATIVE  NEGATIVE Final  . WBC 04/05/2013 8.1  4.0 - 10.5 K/uL Final  . RBC 04/05/2013 4.58  3.87 - 5.11 MIL/uL Final  . Hemoglobin 04/05/2013 14.7  12.0 - 15.0 g/dL Final  . HCT 14/78/2956 44.4  36.0 - 46.0 % Final  . MCV 04/05/2013 96.9  78.0 - 100.0 fL Final  . MCH 04/05/2013 32.1  26.0 - 34.0 pg Final  . MCHC 04/05/2013 33.1  30.0 - 36.0 g/dL Final  . RDW 21/30/8657 13.7  11.5 - 15.5 % Final  . Platelets 04/05/2013 256  150 - 400 K/uL Final  . MRSA, PCR 04/05/2013 NEGATIVE  NEGATIVE Final  . Staphylococcus aureus 04/05/2013 NEGATIVE  NEGATIVE Final   Comment:                                 The Xpert SA Assay (FDA                          approved for NASAL specimens                          in patients over 34 years of age),                          is one component of                          a comprehensive surveillance                          program.  Test performance has  been validated by [facility] for patients greater                          than or equal to 70 year old.                          It is not intended                          to diagnose infection nor to                          guide or monitor treatment.  . Squamous Epithelial / LPF 04/05/2013 FEW* RARE Final  . RBC / HPF 04/05/2013 3-6  <3 RBC/hpf Final  . Bacteria, UA 04/05/2013 FEW* RARE Final      Hospital Course: Carrie Drake is a 67 y.o. who was admitted to [facility]. They were brought to the operating room on 04/11/2013 and underwent Procedure(s): RIGHT ROTATOR CUFF REPAIR SHOULDER OPEN, ACROMIONECTOMY w/ GRAFT.  Patient tolerated the procedure well and was later transferred to the recovery room and then to the orthopaedic floor for postoperative care.  They were given PO and IV analgesics for pain control following their surgery.  They were given 24 hours of postoperative antibiotics of    Anti-infectives   Start     Dose/Rate Route Frequency Ordered Stop   04/11/13 2000  clindamycin (CLEOCIN) IVPB 600 mg     600 mg 100 mL/hr over 30 Minutes Intravenous Every 6 hours 04/11/13 1555 04/12/13 0751   04/11/13 1401  polymyxin B 500,000 Units, bacitracin 50,000 Units in sodium chloride irrigation 0.9 % 500 mL irrigation  Status:  Discontinued       As needed 04/11/13 1401 04/11/13 1448   04/11/13 1120  clindamycin (CLEOCIN) IVPB 900 mg     900 mg 100 mL/hr over 30 Minutes Intravenous On call to O.R. 04/11/13 1120 04/11/13 1315    OT were ordered for sling management and ADL instruction.  Discharge planning consulted to help with postop disposition and equipment needs.  Patient was seen in rounds and was ready to go home.   Discharge Medications: Prior to Admission medications   Medication Sig Start Date End Date Taking? Authorizing Provider  atorvastatin (LIPITOR) 80 MG tablet Take 80 mg by mouth at bedtime.   Yes Historical Provider, MD  Calcium Carbonate-Vitamin D (CALCIUM 500 + D PO) Take 1 tablet by mouth daily.   Yes Historical Provider, MD  lansoprazole (PREVACID) 30 MG capsule Take 30 mg by mouth every evening.    Yes Historical Provider, MD  levothyroxine (SYNTHROID, LEVOTHROID) 50 MCG tablet Take 50 mcg by mouth daily before breakfast.   Yes Historical Provider, MD  aspirin EC 81 MG tablet Take 81 mg by mouth daily.    Historical Provider, MD  methocarbamol (ROBAXIN) 500 MG tablet Take 1 tablet (500 mg total) by mouth 4 (four) times daily. 04/12/13   Jacki Cones, MD  oxyCODONE-acetaminophen (PERCOCET) 10-325 MG per tablet Take 1 tablet by mouth every 4 (four) hours as needed for pain. 04/12/13   Jacki Cones, MD    Diet: Regular diet Activity:Wear sling at all times Follow-up:in  7-10 days Disposition - Home Discharged Condition: fair   Discharge Orders   Future Orders Complete By Expires     Call MD / Call 911  As directed     Comments:      If you  experience chest pain or shortness of breath, CALL 911 and be transported to the hospital emergency room.  If you develope a fever above 101 F, pus (white drainage) or increased drainage or redness at the wound, or calf pain, call your surgeon's office.    Constipation Prevention  As directed     Comments:      Drink plenty of fluids.  Prune juice may be helpful.  You may use a stool softener, such as Colace (over the counter) 100 mg twice a day.  Use MiraLax (over the counter) for constipation as needed.    Diet - low sodium heart healthy  As directed     Discharge instructions  As directed     Comments:      Keep your sling on at all times, including sleeping in your sling.  The only time you should remove your sling is to shower only but you need to keep your hand against your chest while you shower.   For the first few days, remove your dressing, tape a piece of saran wrap over your incision, take your shower, then remove the saran wrap and put a clean dressing on, then reapply your sling.  After two days you can shower without the saran wrap.   Call Dr. Darrelyn Hillock if any wound complications or temperature of 101 degrees F or over.   Call the office for an appointment to see Dr. Darrelyn Hillock in two weeks: [phone] and ask for Dr. Jeannetta Ellis nurse, [facility] [place].    Driving restrictions  As directed     Comments:      No driving for 2 weeks    Increase activity slowly as tolerated  As directed         Medication List    TAKE these medications       aspirin EC 81 MG tablet  Take 81 mg by mouth daily.     atorvastatin 80 MG tablet  Commonly known as:  LIPITOR  Take 80 mg by mouth at bedtime.     CALCIUM 500 + D PO  Take 1 tablet by mouth daily.     lansoprazole 30 MG capsule  Commonly known as:  PREVACID  Take 30 mg by mouth every evening.     levothyroxine 50 MCG tablet  Commonly known as:  SYNTHROID, LEVOTHROID  Take 50 mcg by mouth daily before breakfast.      methocarbamol 500 MG tablet  Commonly known as:  ROBAXIN  Take 1 tablet (500 mg total) by mouth 4 (four) times daily.     oxyCODONE-acetaminophen 10-325 MG per tablet  Commonly known as:  PERCOCET  Take 1 tablet by mouth every 4 (four) hours as needed for pain.         SignedCeledonio Savage, Othell Diluzio LAUREN 04/18/2013, 10:41 AM

## 2013-05-01 ENCOUNTER — Ambulatory Visit: Payer: Medicare Other | Attending: Orthopedic Surgery | Admitting: Physical Therapy

## 2013-05-01 DIAGNOSIS — M25519 Pain in unspecified shoulder: Secondary | ICD-10-CM | POA: Insufficient documentation

## 2013-05-01 DIAGNOSIS — M25619 Stiffness of unspecified shoulder, not elsewhere classified: Secondary | ICD-10-CM | POA: Insufficient documentation

## 2013-05-01 DIAGNOSIS — IMO0001 Reserved for inherently not codable concepts without codable children: Secondary | ICD-10-CM | POA: Insufficient documentation

## 2013-05-03 ENCOUNTER — Ambulatory Visit: Payer: Medicare Other | Admitting: Physical Therapy

## 2013-05-07 ENCOUNTER — Ambulatory Visit: Payer: Medicare Other | Admitting: Physical Therapy

## 2013-05-10 ENCOUNTER — Ambulatory Visit: Payer: Medicare Other | Admitting: Physical Therapy

## 2013-05-14 ENCOUNTER — Ambulatory Visit: Payer: Medicare Other | Admitting: Physical Therapy

## 2013-05-17 ENCOUNTER — Ambulatory Visit: Payer: Medicare Other | Admitting: Physical Therapy

## 2013-05-22 ENCOUNTER — Ambulatory Visit: Payer: Medicare Other | Admitting: Physical Therapy

## 2013-05-24 ENCOUNTER — Ambulatory Visit: Payer: Medicare Other | Admitting: Physical Therapy

## 2013-05-28 ENCOUNTER — Ambulatory Visit: Payer: Medicare Other | Admitting: Physical Therapy

## 2013-05-31 ENCOUNTER — Ambulatory Visit: Payer: Medicare Other | Admitting: Physical Therapy

## 2013-06-05 ENCOUNTER — Ambulatory Visit: Payer: Medicare Other | Attending: Orthopedic Surgery | Admitting: Physical Therapy

## 2013-06-05 DIAGNOSIS — M25619 Stiffness of unspecified shoulder, not elsewhere classified: Secondary | ICD-10-CM | POA: Insufficient documentation

## 2013-06-05 DIAGNOSIS — IMO0001 Reserved for inherently not codable concepts without codable children: Secondary | ICD-10-CM | POA: Insufficient documentation

## 2013-06-05 DIAGNOSIS — M25519 Pain in unspecified shoulder: Secondary | ICD-10-CM | POA: Insufficient documentation

## 2013-06-08 ENCOUNTER — Ambulatory Visit: Payer: Medicare Other | Admitting: Physical Therapy

## 2013-06-11 ENCOUNTER — Ambulatory Visit: Payer: Medicare Other | Admitting: Physical Therapy

## 2013-06-15 ENCOUNTER — Ambulatory Visit: Payer: Medicare Other | Admitting: Physical Therapy

## 2013-06-19 ENCOUNTER — Ambulatory Visit: Payer: Medicare Other | Admitting: Physical Therapy

## 2013-06-21 ENCOUNTER — Ambulatory Visit: Payer: Medicare Other | Admitting: Physical Therapy

## 2013-06-26 ENCOUNTER — Ambulatory Visit: Payer: Medicare Other | Admitting: Physical Therapy

## 2013-06-29 ENCOUNTER — Ambulatory Visit: Payer: Medicare Other | Admitting: Physical Therapy

## 2013-07-03 ENCOUNTER — Ambulatory Visit: Payer: Medicare Other | Attending: Orthopedic Surgery | Admitting: Physical Therapy

## 2013-07-03 DIAGNOSIS — M25619 Stiffness of unspecified shoulder, not elsewhere classified: Secondary | ICD-10-CM | POA: Insufficient documentation

## 2013-07-03 DIAGNOSIS — IMO0001 Reserved for inherently not codable concepts without codable children: Secondary | ICD-10-CM | POA: Insufficient documentation

## 2013-07-03 DIAGNOSIS — M25519 Pain in unspecified shoulder: Secondary | ICD-10-CM | POA: Insufficient documentation

## 2013-07-05 ENCOUNTER — Ambulatory Visit: Payer: Medicare Other | Admitting: Physical Therapy

## 2013-07-09 ENCOUNTER — Ambulatory Visit: Payer: Medicare Other | Admitting: Physical Therapy

## 2013-07-12 ENCOUNTER — Ambulatory Visit: Payer: Medicare Other | Admitting: Physical Therapy

## 2013-07-16 ENCOUNTER — Ambulatory Visit: Payer: Medicare Other | Admitting: Physical Therapy

## 2013-07-20 ENCOUNTER — Ambulatory Visit: Payer: Medicare Other | Admitting: Physical Therapy

## 2013-07-25 ENCOUNTER — Ambulatory Visit: Payer: Medicare Other | Admitting: Physical Therapy

## 2013-07-27 ENCOUNTER — Ambulatory Visit: Payer: Medicare Other | Admitting: Physical Therapy

## 2013-07-30 ENCOUNTER — Ambulatory Visit: Payer: Medicare Other | Admitting: Physical Therapy

## 2013-08-02 ENCOUNTER — Ambulatory Visit: Payer: Medicare Other | Attending: Orthopedic Surgery | Admitting: Physical Therapy

## 2013-08-02 DIAGNOSIS — M25519 Pain in unspecified shoulder: Secondary | ICD-10-CM | POA: Insufficient documentation

## 2013-08-02 DIAGNOSIS — IMO0001 Reserved for inherently not codable concepts without codable children: Secondary | ICD-10-CM | POA: Insufficient documentation

## 2013-08-02 DIAGNOSIS — M25619 Stiffness of unspecified shoulder, not elsewhere classified: Secondary | ICD-10-CM | POA: Insufficient documentation

## 2013-08-06 ENCOUNTER — Ambulatory Visit: Payer: Medicare Other | Admitting: Physical Therapy

## 2013-08-09 ENCOUNTER — Ambulatory Visit: Payer: Medicare Other | Admitting: Physical Therapy

## 2013-08-14 ENCOUNTER — Ambulatory Visit: Payer: Medicare Other | Admitting: Physical Therapy

## 2013-08-16 ENCOUNTER — Ambulatory Visit: Payer: Medicare Other | Admitting: Physical Therapy

## 2013-08-20 ENCOUNTER — Ambulatory Visit: Payer: Medicare Other | Admitting: Physical Therapy

## 2013-08-21 ENCOUNTER — Ambulatory Visit: Payer: Medicare Other | Admitting: Physical Therapy

## 2013-08-23 ENCOUNTER — Ambulatory Visit: Payer: Medicare Other | Admitting: Physical Therapy

## 2013-08-27 ENCOUNTER — Ambulatory Visit: Payer: Medicare Other | Admitting: Physical Therapy

## 2013-08-30 ENCOUNTER — Ambulatory Visit: Payer: Medicare Other | Admitting: Physical Therapy

## 2013-09-05 ENCOUNTER — Ambulatory Visit: Payer: Medicare Other | Attending: Orthopedic Surgery | Admitting: Physical Therapy

## 2013-09-05 DIAGNOSIS — IMO0001 Reserved for inherently not codable concepts without codable children: Secondary | ICD-10-CM | POA: Insufficient documentation

## 2013-09-05 DIAGNOSIS — M25619 Stiffness of unspecified shoulder, not elsewhere classified: Secondary | ICD-10-CM | POA: Insufficient documentation

## 2013-09-05 DIAGNOSIS — M25519 Pain in unspecified shoulder: Secondary | ICD-10-CM | POA: Insufficient documentation

## 2013-09-07 ENCOUNTER — Ambulatory Visit: Payer: Medicare Other | Admitting: Physical Therapy

## 2013-09-09 ENCOUNTER — Encounter (HOSPITAL_COMMUNITY): Payer: Self-pay | Admitting: Emergency Medicine

## 2013-09-09 ENCOUNTER — Emergency Department (HOSPITAL_COMMUNITY): Payer: Medicare Other

## 2013-09-09 ENCOUNTER — Emergency Department (HOSPITAL_COMMUNITY)
Admission: EM | Admit: 2013-09-09 | Discharge: 2013-09-10 | Disposition: A | Discharge status: Home / Self Care | Payer: Medicare Other | Attending: Emergency Medicine | Admitting: Emergency Medicine

## 2013-09-09 DIAGNOSIS — Z79899 Other long term (current) drug therapy: Secondary | ICD-10-CM | POA: Insufficient documentation

## 2013-09-09 DIAGNOSIS — Z880 Allergy status to penicillin: Secondary | ICD-10-CM | POA: Insufficient documentation

## 2013-09-09 DIAGNOSIS — F17200 Nicotine dependence, unspecified, uncomplicated: Secondary | ICD-10-CM | POA: Insufficient documentation

## 2013-09-09 DIAGNOSIS — W19XXXA Unspecified fall, initial encounter: Secondary | ICD-10-CM

## 2013-09-09 DIAGNOSIS — S20221A Contusion of right back wall of thorax, initial encounter: Secondary | ICD-10-CM

## 2013-09-09 DIAGNOSIS — Y9389 Activity, other specified: Secondary | ICD-10-CM | POA: Insufficient documentation

## 2013-09-09 DIAGNOSIS — Z7982 Long term (current) use of aspirin: Secondary | ICD-10-CM | POA: Insufficient documentation

## 2013-09-09 DIAGNOSIS — S20229A Contusion of unspecified back wall of thorax, initial encounter: Secondary | ICD-10-CM | POA: Insufficient documentation

## 2013-09-09 DIAGNOSIS — K219 Gastro-esophageal reflux disease without esophagitis: Secondary | ICD-10-CM | POA: Insufficient documentation

## 2013-09-09 DIAGNOSIS — E039 Hypothyroidism, unspecified: Secondary | ICD-10-CM | POA: Insufficient documentation

## 2013-09-09 DIAGNOSIS — R296 Repeated falls: Secondary | ICD-10-CM | POA: Insufficient documentation

## 2013-09-09 DIAGNOSIS — Y92009 Unspecified place in unspecified non-institutional (private) residence as the place of occurrence of the external cause: Secondary | ICD-10-CM | POA: Insufficient documentation

## 2013-09-09 DIAGNOSIS — Z85828 Personal history of other malignant neoplasm of skin: Secondary | ICD-10-CM | POA: Insufficient documentation

## 2013-09-09 MED ORDER — FENTANYL CITRATE 0.05 MG/ML IJ SOLN
50.0000 ug | INTRAMUSCULAR | Status: DC | PRN
  Administered 2013-09-09: 50 ug via INTRAVENOUS
  Filled 2013-09-09: qty 2

## 2013-09-09 MED ORDER — SODIUM CHLORIDE 0.9 % IV SOLN
INTRAVENOUS | Status: DC
  Administered 2013-09-09: via INTRAVENOUS

## 2013-09-09 MED ORDER — ONDANSETRON HCL 4 MG/2ML IJ SOLN
4.0000 mg | Freq: Once | INTRAMUSCULAR | Status: AC
  Administered 2013-09-09: 4 mg via INTRAVENOUS
  Filled 2013-09-09: qty 2

## 2013-09-09 NOTE — ED Provider Notes (Signed)
CSN: [id number]     Arrival date & time 09/09/13  2256 History   First MD Initiated Contact with Patient 09/09/13 2305     Chief Complaint  Patient presents with  . Fall   (Consider location/radiation/quality/duration/timing/severity/associated sxs/prior Treatment) HPI Hx per PT - at home, fixing her blinds, standing on ledge of the window and fell injuring her back.  She c/o severe sharp pain mostly R sided lower back, hurts to move, she lives alone, called 911.  No LE weakness or numbness. No ABD pain. no extremity injury. No hip pain.  No neck pain. She feels like she may have hit her head, but states it is "nothing" and declines any imaging. No LOC. No amnesia, no bleeding or laceration.  Past Medical History  Diagnosis Date  . Hypothyroidism   . 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 GERD (gastroesophageal reflux disease)   . Right rotator cuff tear    Past Surgical History  Procedure Laterality Date  . Cholecystectomy  yrs ago  . Left foot bunion removed  yrs ago  . Cervical neck surgery for c 2 fx  1997    plate inserted, limited neck motion to left  . Back surgery  yrs ago    lower back surgery x 2  . Shoulder open rotator cuff repair Right 04/11/2013    Procedure: RIGHT ROTATOR CUFF REPAIR SHOULDER OPEN, ACROMIONECTOMY w/ GRAFT;  Surgeon: Jacki Cones, MD;  Location: [facility] ORS;  Service: Orthopedics;  Laterality: Right;   Family History  Problem Relation Age of Onset  . Alzheimer's disease Mother   . Diabetes Father   . Diabetes Brother    History  Substance Use Topics  . Smoking status: Current Every Day Smoker -- 0.50 packs/day for 30 years    Types: Cigarettes  . Smokeless tobacco: Never Used  . Alcohol Use: Yes     Comment: occasional   OB History   Grav Para Term Preterm Abortions TAB SAB Ect Mult Living                 Review of Systems  Constitutional: Negative for fever and chills.  Eyes: Negative for visual disturbance.  Respiratory:  Negative for shortness of breath.   Cardiovascular: Negative for chest pain.  Gastrointestinal: Negative for abdominal pain.  Genitourinary: Negative for hematuria.  Musculoskeletal: Positive for back pain. Negative for neck pain and neck stiffness.  Skin: Positive for wound. Negative for rash.  Neurological: Negative for weakness, numbness and headaches.  All other systems reviewed and are negative.    Allergies  Morphine and related; Sulfa antibiotics; Penicillins; and Tetracyclines & related  Home Medications   Current Outpatient Rx  Name  Route  Sig  Dispense  Refill  . aspirin EC 81 MG tablet   Oral   Take 81 mg by mouth daily.         Marland Kitchen atorvastatin (LIPITOR) 80 MG tablet   Oral   Take 80 mg by mouth at bedtime.         . Calcium Carbonate-Vitamin D (CALCIUM 500 + D PO)   Oral   Take 1 tablet by mouth daily.         . lansoprazole (PREVACID) 30 MG capsule   Oral   Take 30 mg by mouth every evening.          Marland Kitchen levothyroxine (SYNTHROID, LEVOTHROID) 50 MCG tablet   Oral   Take 50 mcg by  mouth daily before breakfast.         . methocarbamol (ROBAXIN) 500 MG tablet   Oral   Take 1 tablet (500 mg total) by mouth 4 (four) times daily.   40 tablet   2   . oxyCODONE-acetaminophen (PERCOCET) 10-325 MG per tablet   Oral   Take 1 tablet by mouth every 4 (four) hours as needed for pain.   40 tablet   0    BP 134/60  Pulse 83  Temp(Src) 97.7 F (36.5 C) (Oral)  Resp 18  Ht 4\' 11"  (1.499 m)  Wt 124 lb (56.246 kg)  BMI 25.03 kg/m2  SpO2 94% Physical Exam  Constitutional: She is oriented to person, place, and time. She appears well-developed and well-nourished.  HENT:  Head: Normocephalic and atraumatic.  No scalp tenderness, abrasion, lac or swelling  Eyes: EOM are normal. Pupils are equal, round, and reactive to light.  Neck: Neck supple.  No C spine tenderness or deformity  Cardiovascular: Normal rate, regular rhythm and intact distal pulses.    Pulmonary/Chest: Effort normal and breath sounds normal. No respiratory distress. She exhibits no tenderness.  Abdominal: Soft. She exhibits no distension. There is no tenderness.  No CVAT  Musculoskeletal: Normal range of motion. She exhibits no edema.  Abrasion and tenderness lower T spine and L spine mostly R sided paraspinal. No associated hip or pelvic tenderness. No LE deficits or areas or tenderness/ deformity  Neurological: She is alert and oriented to person, place, and time. No cranial nerve deficit.  Skin: Skin is warm and dry.    ED Course  Procedures (including critical care time) Labs Review Labs Reviewed - No data to display Imaging Review Dg Thoracic Spine 2 View  09/10/2013   CLINICAL DATA:  Status post fall; upper back pain.  EXAM: THORACIC SPINE - 2 VIEW  COMPARISON:  None.  FINDINGS: There is no evidence of fracture or subluxation. Vertebral bodies demonstrate normal height and alignment. Intervertebral disc spaces are preserved.  The visualized portions of both lungs are clear. The mediastinum is unremarkable in appearance. Clips are noted within the right upper quadrant, reflecting prior cholecystectomy. Cervical spinal fusion hardware is noted.  IMPRESSION: No evidence of fracture or subluxation along the thoracic spine.   Electronically Signed   By: Roanna Raider M.D.   On: 09/10/2013 00:26   Dg Lumbar Spine Complete  09/10/2013   CLINICAL DATA:  Status post fall; severe lower back pain and abrasions.  EXAM: LUMBAR SPINE - COMPLETE 4+ VIEW  COMPARISON:  None.  FINDINGS: There is no evidence of fracture or subluxation. Vertebral bodies demonstrate normal height and alignment. Intervertebral disc spaces are preserved. The visualized neural foramina are grossly unremarkable in appearance.  The visualized bowel gas pattern is unremarkable in appearance; air and stool are noted within the colon. The sacroiliac joints are within normal limits. Relatively diffuse calcification  is noted along the abdominal aorta. Clips are noted within the right upper quadrant, reflecting prior cholecystectomy.  IMPRESSION: 1. No evidence of fracture or subluxation along the lumbar spine. 2. Relatively diffuse calcification along the abdominal aorta.   Electronically Signed   By: Roanna Raider M.D.   On: 09/10/2013 00:29    IV fentanyl and Dilaudid. Robaxin. Ice  No acute ABd serial exams. No neuro deficits  2:42 AM recheck, feeling better, ambulating and requesting d/c home,. Has Rx for percocet and Robaxin at home, declines RX. She has PCP and ortho f/u as needed. She  is reliable, has a friend with her who can help her at home as needed. Occult injury precautions and return precautions verbalized as understood.  MDM  Dx: Fall, Contusion to back  Evaluated with imaging reviewed as above Treated and improved with IV narcotics, muscle relaxers VS and nurses notes reviewed   Sunnie Nielsen, MD 09/10/13 [id number]

## 2013-09-09 NOTE — ED Notes (Signed)
Pt trying to fix blinds standing on step stool loss balance and fell. Pt having pain to right side of lower back.Pt most comfortable lying on left side. Given of fentynal for pain. Pt denies loc but did hit head with no lacerations.

## 2013-09-09 NOTE — ED Notes (Signed)
Bed: WA01 Expected date: 09/09/13 Expected time: 10:54 PM Means of arrival: Ambulance Comments: Bed 1, EMS, 37 F, Fall

## 2013-09-10 ENCOUNTER — Ambulatory Visit: Payer: Medicare Other | Admitting: Physical Therapy

## 2013-09-10 MED ORDER — METHOCARBAMOL 100 MG/ML IJ SOLN
750.0000 mg | Freq: Once | INTRAMUSCULAR | Status: AC
  Administered 2013-09-10: 750 mg via INTRAMUSCULAR
  Filled 2013-09-10: qty 7.5

## 2013-09-10 MED ORDER — HYDROMORPHONE HCL PF 1 MG/ML IJ SOLN
1.0000 mg | Freq: Once | INTRAMUSCULAR | Status: AC
  Administered 2013-09-10: 1 mg via INTRAVENOUS
  Filled 2013-09-10: qty 1

## 2013-09-10 NOTE — ED Notes (Signed)
Pt did well with ambulation. MD aware.

## 2013-09-13 ENCOUNTER — Ambulatory Visit: Payer: Medicare Other | Admitting: Physical Therapy

## 2013-11-06 ENCOUNTER — Encounter: Payer: Self-pay | Admitting: Neurology

## 2013-11-07 ENCOUNTER — Encounter (INDEPENDENT_AMBULATORY_CARE_PROVIDER_SITE_OTHER): Payer: Self-pay

## 2013-11-07 ENCOUNTER — Encounter: Payer: Self-pay | Admitting: Neurology

## 2013-11-07 ENCOUNTER — Ambulatory Visit (INDEPENDENT_AMBULATORY_CARE_PROVIDER_SITE_OTHER): Payer: Medicare Other | Admitting: Neurology

## 2013-11-07 VITALS — BP 120/80 | HR 64 | Wt 122.5 lb

## 2013-11-07 DIAGNOSIS — M545 Low back pain, unspecified: Secondary | ICD-10-CM

## 2013-11-07 HISTORY — DX: Low back pain, unspecified: M54.50

## 2013-11-07 NOTE — Patient Instructions (Signed)
Chronic Back Pain   When back pain lasts longer than 3 months, it is called chronic back pain.People with chronic back pain often go through certain periods that are more intense (flare-ups).   CAUSES  Chronic back pain can be caused by wear and tear (degeneration) on different structures in your back. These structures include:   The bones of your spine (vertebrae) and the joints surrounding your spinal cord and nerve roots (facets).   The strong, fibrous tissues that connect your vertebrae (ligaments).  Degeneration of these structures may result in pressure on your nerves. This can lead to constant pain.  HOME CARE INSTRUCTIONS   Avoid bending, heavy lifting, prolonged sitting, and activities which make the problem worse.   Take brief periods of rest throughout the day to reduce your pain. Lying down or standing usually is better than sitting while you are resting.   Take over-the-counter or prescription medicines only as directed by your caregiver.  SEEK IMMEDIATE MEDICAL CARE IF:    You have weakness or numbness in one of your legs or feet.   You have trouble controlling your bladder or bowels.   You have nausea, vomiting, abdominal pain, shortness of breath, or fainting.  Document Released: 11/25/2004 Document Revised: 01/10/2012 Document Reviewed: 10/02/2011  [employer] Patient Information 2014 [employer].

## 2013-11-07 NOTE — Progress Notes (Signed)
Reason for visit: Back pain  Carrie Drake is a 68 y.o. female  History of present illness:  Carrie Drake is a 68 year old right-handed white female with a history of prior lumbosacral spine surgery on 2 occasions and cervical spine surgery on one occasion. The patient fell around 09/10/2013 off of a step ladder. The patient fell about 4 feet, landing on a flower pot with her right flank. The patient sustained significant pain from this fall in the upper lumbar area and right flank. The patient had some pain with breathing, and she developed some hematuria suggesting a contusion of the kidney. The hematuria has cleared since that time. The patient has continued to have some pain in the right flank and back, without radiation into the legs. The patient reports no weakness of the extremities or problems with balance. The patient denies any change in the way the bowels or the bladder are working. The patient has had recent right rotator cuff tear surgery, and her shoulder pain has increased since the fall, as has her right neck pain. The patient indicates that the pain level has improved gradually over time, and she has not been on a medication for this. The patient had Robaxin and Ultram to take, but she never went on the medication. The patient has not undergone physical therapy. The patient claims that she has had MRI evaluation of the low back recently, but the report of this is not available to me. The patient does not remember where the scan was done. The patient was told that she had a bulging disc, but she is not sure of the level. The patient is sent to this office for further evaluation.  Past Medical History  Diagnosis Date  . Hypothyroidism   . 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 GERD (gastroesophageal reflux disease)   . Right rotator cuff tear   . Tobacco use disorder   . Other and unspecified hyperlipidemia   . Migraine, unspecified, without mention of intractable  migraine without mention of status migrainosus   . Unspecified disorder of skin and subcutaneous tissue   . Backache, unspecified   . Lumbago 11/07/2013    Past Surgical History  Procedure Laterality Date  . Cholecystectomy  yrs ago  . Left foot bunion removed  yrs ago  . Cervical neck surgery for c 2 fx  1997    plate inserted, limited neck motion to left  . Back surgery  yrs ago    lower back surgery x 2  . Shoulder open rotator cuff repair Right 04/11/2013    Procedure: RIGHT ROTATOR CUFF REPAIR SHOULDER OPEN, ACROMIONECTOMY w/ GRAFT;  Surgeon: Tobi Bastos, MD;  Location: [facility];  Service: Orthopedics;  Laterality: Right;  . Laminectomy    . Cosmetic surgery      FACE LIFT 2012  . Tonsillectomy      Family History  Problem Relation Age of Onset  . Alzheimer's disease Mother   . Diabetes Father   . Diabetes Brother     Social history:  reports that she has been smoking Cigarettes.  She has a 15 pack-year smoking history. She has never used smokeless tobacco. She reports that she drinks alcohol. She reports that she does not use illicit drugs.  Medications:  Current Outpatient Prescriptions on File Prior to Visit  Medication Sig Dispense Refill  . aspirin EC 81 MG tablet Take 81 mg by mouth every morning.       Marland Kitchen  atorvastatin (LIPITOR) 80 MG tablet Take 80 mg by mouth at bedtime.      . lansoprazole (PREVACID) 30 MG capsule Take 30 mg by mouth every evening.       Marland Kitchen levothyroxine (SYNTHROID, LEVOTHROID) 50 MCG tablet Take 50 mcg by mouth daily before breakfast.      . calcium carbonate (TUMS - DOSED IN MG ELEMENTAL CALCIUM) 500 MG chewable tablet Chew 1 tablet by mouth every morning.       No current facility-administered medications on file prior to visit.      Allergies  Allergen Reactions  . Morphine And Related Nausea And Vomiting  . Sulfa Antibiotics Anaphylaxis  . Penicillins Hives  . Tetracyclines & Related Hives    Bull-hives    ROS:  Out of a  complete 14 system review of symptoms, the patient complains only of the following symptoms, and all other reviewed systems are negative.  Trouble swallowing Achy muscles Allergies, runny nose Headache, numbness Depression, anxiety, insomnia  Blood pressure 120/80, pulse 64, weight 122 lb 8 oz (55.566 kg).  Physical Exam  General: The patient is alert and cooperative at the time of the examination.  Eyes: Pupils are equal, round, and reactive to light. Discs are flat bilaterally.  Neck: The neck is supple, no carotid bruits are noted.  Respiratory: The respiratory examination is clear.  Cardiovascular: The cardiovascular examination reveals a regular rate and rhythm, no obvious murmurs or rubs are noted.   Neuromuscular: The patient has good range of movement of the lumbosacral spine. No significant pain with palpation over the right flank or over the spine is noted.  Skin: Extremities are without significant edema.  Neurologic Exam  Mental status: The patient is alert and oriented x 3 at the time of the examination. The patient has apparent normal recent and remote memory, with an apparently normal attention span and concentration ability.  Cranial nerves: Facial symmetry is present. There is good sensation of the face to pinprick and soft touch bilaterally. The strength of the facial muscles and the muscles to head turning and shoulder shrug are normal bilaterally. Speech is well enunciated, no aphasia or dysarthria is noted. Extraocular movements are full. Visual fields are full. The tongue is midline, and the patient has symmetric elevation of the soft palate. No obvious hearing deficits are noted.  Motor: The motor testing reveals 5 over 5 strength of all 4 extremities. Good symmetric motor tone is noted throughout.  Sensory: Sensory testing is intact to pinprick, soft touch, vibration sensation, and position sense on all 4 extremities, with exception that there is some  decreased pinprick sensation on the anterolateral aspect of the left leg up to the knee. No evidence of extinction is noted.  Coordination: Cerebellar testing reveals good finger-nose-finger and heel-to-shin bilaterally.  Gait and station: Gait is normal. Tandem gait is normal. Romberg is negative. No drift is seen. The patient is able to walk on heels and the toes.  Reflexes: Deep tendon reflexes are symmetric and normal bilaterally, with the exception that the left ankle jerk reflex is absent. Toes are downgoing bilaterally.   Assessment/Plan:  1. Prior lumbosacral spine surgery, cervical spine surgery  2. Recent fall, neuromuscular injury and right flank, possible renal contusion  The patient is having small going right upper lumbar and flank pain. The patient has good range of movement of the lumbosacral spine. The patient could benefit from neuromuscular therapy, but she does not wish to pursue this at this time. The  patient is to go back on Robaxin. The patient does not wish to be on any medications for pain. The patient will followup in 3-4 months. I will need to get the report of the prior lumbosacral MRI study, but the patient does not recall the name of the scanner location.  Jill Alexanders MD 11/07/2013 9:46 PM  [facility] [address] [place], [place] [address]  Phone [phone] Fax [phone]

## 2013-11-09 ENCOUNTER — Telehealth: Payer: Self-pay | Admitting: Neurology

## 2013-11-09 NOTE — Telephone Encounter (Signed)
The MRI of the lumbosacral spine report is sent to our office. This was done at [facility] [place]. This revealed evidence of a prior laminectomy at the L4-5 level with a shallow disc protrusion centrally at this level without nerve root impingement. A left laminotomy at L 5-S1 level was seen with a shallow disc bulge at this level, no nerve root impingement was noted. The study was otherwise unremarkable.

## 2014-04-09 ENCOUNTER — Ambulatory Visit: Payer: Self-pay | Admitting: Neurology

## 2014-04-19 ENCOUNTER — Ambulatory Visit: Payer: Medicare Other | Admitting: Neurology

## 2014-05-08 ENCOUNTER — Other Ambulatory Visit: Payer: Self-pay | Admitting: Gastroenterology

## 2014-05-20 ENCOUNTER — Encounter (HOSPITAL_COMMUNITY): Payer: Self-pay | Admitting: Pharmacy Technician

## 2014-05-30 ENCOUNTER — Encounter (HOSPITAL_COMMUNITY): Payer: Self-pay | Admitting: *Deleted

## 2014-06-10 ENCOUNTER — Encounter (HOSPITAL_COMMUNITY): Payer: Medicare Other | Admitting: Anesthesiology

## 2014-06-10 ENCOUNTER — Ambulatory Visit (HOSPITAL_COMMUNITY): Payer: Medicare Other | Admitting: Anesthesiology

## 2014-06-10 ENCOUNTER — Encounter (HOSPITAL_COMMUNITY)
Admission: RE | Disposition: A | Discharge status: Home / Self Care | Payer: Self-pay | Source: Ambulatory Visit | Attending: Gastroenterology

## 2014-06-10 ENCOUNTER — Ambulatory Visit (HOSPITAL_COMMUNITY)
Admission: RE | Admit: 2014-06-10 | Discharge: 2014-06-10 | Disposition: A | Discharge status: Home / Self Care | Payer: Medicare Other | Source: Ambulatory Visit | Attending: Gastroenterology | Admitting: Gastroenterology

## 2014-06-10 ENCOUNTER — Encounter (HOSPITAL_COMMUNITY): Payer: Self-pay | Admitting: *Deleted

## 2014-06-10 DIAGNOSIS — Z79899 Other long term (current) drug therapy: Secondary | ICD-10-CM | POA: Diagnosis not present

## 2014-06-10 DIAGNOSIS — I10 Essential (primary) hypertension: Secondary | ICD-10-CM | POA: Insufficient documentation

## 2014-06-10 DIAGNOSIS — Z1211 Encounter for screening for malignant neoplasm of colon: Secondary | ICD-10-CM | POA: Diagnosis not present

## 2014-06-10 DIAGNOSIS — D131 Benign neoplasm of stomach: Secondary | ICD-10-CM | POA: Insufficient documentation

## 2014-06-10 DIAGNOSIS — R1310 Dysphagia, unspecified: Secondary | ICD-10-CM | POA: Diagnosis not present

## 2014-06-10 DIAGNOSIS — F17200 Nicotine dependence, unspecified, uncomplicated: Secondary | ICD-10-CM | POA: Diagnosis not present

## 2014-06-10 DIAGNOSIS — E039 Hypothyroidism, unspecified: Secondary | ICD-10-CM | POA: Diagnosis not present

## 2014-06-10 DIAGNOSIS — K449 Diaphragmatic hernia without obstruction or gangrene: Secondary | ICD-10-CM | POA: Diagnosis not present

## 2014-06-10 DIAGNOSIS — K219 Gastro-esophageal reflux disease without esophagitis: Secondary | ICD-10-CM | POA: Insufficient documentation

## 2014-06-10 DIAGNOSIS — Z7982 Long term (current) use of aspirin: Secondary | ICD-10-CM | POA: Insufficient documentation

## 2014-06-10 DIAGNOSIS — E7800 Pure hypercholesterolemia, unspecified: Secondary | ICD-10-CM | POA: Insufficient documentation

## 2014-06-10 DIAGNOSIS — D126 Benign neoplasm of colon, unspecified: Secondary | ICD-10-CM | POA: Diagnosis not present

## 2014-06-10 DIAGNOSIS — K5730 Diverticulosis of large intestine without perforation or abscess without bleeding: Secondary | ICD-10-CM | POA: Insufficient documentation

## 2014-06-10 HISTORY — DX: Other complications of anesthesia, initial encounter: T88.59XA

## 2014-06-10 HISTORY — DX: Adverse effect of unspecified anesthetic, initial encounter: T41.45XA

## 2014-06-10 HISTORY — PX: ESOPHAGOGASTRODUODENOSCOPY (EGD) WITH PROPOFOL: SHX5813

## 2014-06-10 HISTORY — PX: COLONOSCOPY WITH PROPOFOL: SHX5780

## 2014-06-10 SURGERY — ESOPHAGOGASTRODUODENOSCOPY (EGD) WITH PROPOFOL
Anesthesia: Monitor Anesthesia Care

## 2014-06-10 MED ORDER — LIDOCAINE HCL (CARDIAC) 20 MG/ML IV SOLN
INTRAVENOUS | Status: AC
  Filled 2014-06-10: qty 5

## 2014-06-10 MED ORDER — LIDOCAINE HCL (CARDIAC) 20 MG/ML IV SOLN
INTRAVENOUS | Status: DC | PRN
  Administered 2014-06-10 (×2): 50 mg via INTRAVENOUS

## 2014-06-10 MED ORDER — PROPOFOL 10 MG/ML IV BOLUS
INTRAVENOUS | Status: AC
  Filled 2014-06-10: qty 20

## 2014-06-10 MED ORDER — PROPOFOL 10 MG/ML IV BOLUS
INTRAVENOUS | Status: DC | PRN
  Administered 2014-06-10: 20 mg via INTRAVENOUS
  Administered 2014-06-10: 30 mg via INTRAVENOUS
  Administered 2014-06-10 (×2): 20 mg via INTRAVENOUS

## 2014-06-10 MED ORDER — LACTATED RINGERS IV SOLN
INTRAVENOUS | Status: DC
  Administered 2014-06-10: 1000 mL via INTRAVENOUS

## 2014-06-10 MED ORDER — SODIUM CHLORIDE 0.9 % IV SOLN: INTRAVENOUS | Status: DC

## 2014-06-10 MED ORDER — BUTAMBEN-TETRACAINE-BENZOCAINE 2-2-14 % EX AERO
INHALATION_SPRAY | CUTANEOUS | Status: DC | PRN
  Administered 2014-06-10: 1 via TOPICAL

## 2014-06-10 MED ORDER — PROPOFOL INFUSION 10 MG/ML OPTIME
INTRAVENOUS | Status: DC | PRN
  Administered 2014-06-10: 160 ug/kg/min via INTRAVENOUS

## 2014-06-10 SURGICAL SUPPLY — 24 items

## 2014-06-10 NOTE — Transfer of Care (Signed)
Immediate Anesthesia Transfer of Care Note  Patient: Carrie Drake  Procedure(s) Performed: Procedure(s): ESOPHAGOGASTRODUODENOSCOPY (EGD) WITH PROPOFOL (N/A) COLONOSCOPY WITH PROPOFOL (N/A)  Patient Location: PACU and Endoscopy Unit  Anesthesia Type:MAC  Level of Consciousness: awake, alert , oriented and patient cooperative  Airway & Oxygen Therapy: Patient Spontanous Breathing and Patient connected to nasal cannula oxygen  Post-op Assessment: Report given to PACU RN, Post -op Vital signs reviewed and stable and Patient moving all extremities X 4  Post vital signs: Reviewed and stable  Complications: No apparent anesthesia complications

## 2014-06-10 NOTE — Discharge Instructions (Signed)
Upper Gastrointestinal Series Care After  These instructions give you information on caring for yourself after your procedure. Your doctor may also give you more specific instructions. Call your doctor if you have any problems or questions after your procedure. HOME CARE  You may go back to your normal diet and activities when you feel able.  Drink enough fluids to keep your pee (urine) clear or pale yellow.  If you have a hard time pooping (constipation), ask your doctor if you should take a medicine to help you poop (laxative). GET HELP RIGHT AWAY IF:  You cannot pass gas.  You have a very hard time pooping.  You have belly (abdominal) pain that gets worse.  You have red, itchy spots (hives) on your skin.  Your throat gets puffy (swells).  You have trouble breathing.  You have a fever.  You have a hard time pooping for more than 3 days.  Your poop looks white or chalky after 3 days.  You have cramps, pain, or watery poop (diarrhea).  You feel sick to your stomach (nauseous), or you throw up (vomit). MAKE SURE YOU:  Understand these instructions.  Will watch your condition.  Will get help right away if you are not doing well or get worse. Document Released: 01/10/2012 Document Reviewed: 01/10/2012 [facility] Patient Information 2015 [place]. This information is not intended to replace advice given to you by your health care provider. Make sure you discuss any questions you have with your health care provider. Colonoscopy, Care After These instructions give you information on caring for yourself after your procedure. Your doctor may also give you more specific instructions. Call your doctor if you have any problems or questions after your procedure. HOME CARE  Do not drive for 24 hours.  Do not sign important papers or use machinery for 24 hours.  You may shower.  You may go back to your usual activities, but go slower for the first 24 hours.  Take rest  breaks often during the first 24 hours.  Walk around or use warm packs on your belly (abdomen) if you have belly cramping or gas.  Drink enough fluids to keep your pee (urine) clear or pale yellow.  Resume your normal diet. Avoid heavy or fried foods.  Avoid drinking alcohol for 24 hours or as told by your doctor.  Only take medicines as told by your doctor. If a tissue sample (biopsy) was taken during the procedure:   Do not take aspirin or blood thinners for 7 days, or as told by your doctor.  Do not drink alcohol for 7 days, or as told by your doctor.  Eat soft foods for the first 24 hours. GET HELP IF: You still have a small amount of blood in your poop (stool) 2-3 days after the procedure. GET HELP RIGHT AWAY IF:  You have more than a small amount of blood in your poop.  You see clumps of tissue (blood clots) in your poop.  Your belly is puffy (swollen).  You feel sick to your stomach (nauseous) or throw up (vomit).  You have a fever.  You have belly pain that gets worse and medicine does not help. MAKE SURE YOU:  Understand these instructions.  Will watch your condition.  Will get help right away if you are not doing well or get worse. Document Released: 11/20/2010 Document Revised: 10/23/2013 Document Reviewed: 06/25/2013 [facility] Patient Information 2015 [place], [place]. This information is not intended to replace advice given to you  by your health care provider. Make sure you discuss any questions you have with your health care provider. ° °

## 2014-06-10 NOTE — H&P (Signed)
  Problem: Esophageal dysphagia. Screening colonoscopy.  History: The patient is a 68 year old female born 02-09-1946. She underwent a normal screening colonoscopy in [place] over 10 years ago.  Approximately 2 years ago she underwent an evaluation for esophageal dysphagia which included a barium esophagram and esophagogastroduodenoscopy. The patient tells me she was diagnosed with esophageal spasm.  The patient is scheduled to undergo a repeat screening colonoscopy and diagnostic esophagogastroduodenoscopy with possible esophageal stricture dilation.  Past medical history: Facelift in 2012. Tonsillectomy. Cholecystectomy. Breast biopsy. Bunionectomy. Cervical and lumbar laminectomy. Hypercholesterolemia. Migraine headache syndrome. Hypertension. Squamous cell skin cancers.  Medication allergies: Sulfa. Morphine. Penicillin. Tetracycline.  Exam: The patient is alert and lying comfortably on the endoscopy stretcher. Lungs are clear to auscultation. Cardiac exam reveals a regular rhythm. Abdomen is soft and nontender to palpation.  Plan: Proceed with diagnostic esophagogastroduodenoscopy with possible esophageal stricture dilation followed by repeat screening colonoscopy

## 2014-06-10 NOTE — Anesthesia Preprocedure Evaluation (Signed)
Anesthesia Evaluation  Patient identified by MRN, date of birth, ID band Patient awake    Reviewed: Allergy & Precautions, H&P , NPO status , Patient's Chart, lab work & pertinent test results  Airway Mallampati: II TM Distance: >3 FB Neck ROM: Full    Dental no notable dental hx.    Pulmonary Current Smoker,  breath sounds clear to auscultation  Pulmonary exam normal       Cardiovascular Exercise Tolerance: Good negative cardio ROS  Rhythm:Regular Rate:Normal     Neuro/Psych Neck, lower back, and shoulder pain.  Neuromuscular disease negative psych ROS   GI/Hepatic Neg liver ROS, GERD-  Medicated,  Endo/Other  Hypothyroidism   Renal/GU negative Renal ROS  negative genitourinary   Musculoskeletal negative musculoskeletal ROS (+)   Abdominal   Peds negative pediatric ROS (+)  Hematology negative hematology ROS (+)   Anesthesia Other Findings   Reproductive/Obstetrics negative OB ROS                           Anesthesia Physical  Anesthesia Plan  ASA: II  Anesthesia Plan: MAC   Post-op Pain Management:    Induction:   Airway Management Planned:   Additional Equipment:   Intra-op Plan:   Post-operative Plan:   Informed Consent: I have reviewed the patients History and Physical, chart, labs and discussed the procedure including the risks, benefits and alternatives for the proposed anesthesia with the patient or authorized representative who has indicated his/her understanding and acceptance.   Dental advisory given  Plan Discussed with: CRNA  Anesthesia Plan Comments:         Anesthesia Quick Evaluation

## 2014-06-10 NOTE — Op Note (Signed)
Problem: Esophageal dysphagia  Endoscopist: Earle Gell  Premedication: Propofol administered by anesthesia  Procedure: Diagnostic esophagogastroduodenoscopy The patient was placed in the left lateral decubitus position. The Pentax gastroscope was passed through the posterior hypopharynx into the proximal esophagus without difficulty. The hypopharynx, larynx, and vocal cords appeared normal  Esophagoscopy: The proximal, mid, and lower segments of the esophageal mucosa appeared normal. The squamocolumnar junction was regular in appearance and noted at 36 cm from the incisor teeth. There was no endoscopic evidence for the presence of Barrett's esophagus or esophageal stricture formation.  Gastroscopy: There was a moderate sized hiatal hernia. Retroflexed view of the gastric cardia and fundus was normal. The diaphragmatic hiatus was patulous. A few less than 5 mm fundic gland appearing polyps were present in the gastric body. A biopsy of the polyp was performed. There were a few scattered circular erosions in the gastric antrum. The gastric pylorus appeared normal.  Duodenoscopy: The duodenal bulb descending duodenum appeared normal.  Assessment:  #1. Moderate sized hiatal hernia  #2. A few small erosions were present in the gastric antrum probably secondary to aspirin use  #3. Otherwise normal esophagogastroduodenoscopy  Recommendation: If esophageal dysphagia persists, schedule high resolution esophageal manometry to look for achalasia.  Procedure: Screening colonoscopy Anal inspection and digital rectal exam were normal. The Pentax pediatric colonoscope was introduced into the rectum and advanced to the cecum. A normal-appearing ileocecal valve and the appendiceal orifice were identified. Colonic preparation for the exam today was good  Rectum. Normal. Retroflexed view of the distal rectum normal  Sigmoid colon and descending colon. Extensive left colonic diverticulosis  Splenic  flexure. Normal  Transverse colon. Extensive colonic diverticulosis  Hepatic flexure. Normal  Ascending colon. Normal  Cecum and ileocecal valve. Adjacent to the appendiceal orifice a 7 mm sessile serrated appearing polyp was removed in piecemeal fashion with the cold snare and submitted for pathological interpretation  Assessment:  #1. Colonic diverticulosis involving the transverse colon, descending colon, and sigmoid colon  #2. A 7 mm polyp was removed adjacent to the appendiceal orifice and the cecum  Recommendation: If cecal polyp returns neoplastic pathologically, the patient should undergo a surveillance colonoscopy in 5 years. If the polyp returns nonneoplastic pathologically, she should undergo a repeat screening colonoscopy in 10 years

## 2014-06-11 ENCOUNTER — Encounter (HOSPITAL_COMMUNITY): Payer: Self-pay | Admitting: Gastroenterology

## 2014-06-12 NOTE — Anesthesia Postprocedure Evaluation (Signed)
  Anesthesia Post-op Note  Patient: Katena Petitjean Shackett  Procedure(s) Performed: Procedure(s) (LRB): ESOPHAGOGASTRODUODENOSCOPY (EGD) WITH PROPOFOL (N/A) COLONOSCOPY WITH PROPOFOL (N/A)  Patient Location: PACU  Anesthesia Type: MAC  Level of Consciousness: awake and alert   Airway and Oxygen Therapy: Patient Spontanous Breathing  Post-op Pain: mild  Post-op Assessment: Post-op Vital signs reviewed, Patient's Cardiovascular Status Stable, Respiratory Function Stable, Patent Airway and No signs of Nausea or vomiting  Last Vitals:  Filed Vitals:   06/10/14 1510  BP: 161/129  Pulse: 82  Temp:   Resp: 14    Post-op Vital Signs: stable   Complications: No apparent anesthesia complications

## 2014-06-24 ENCOUNTER — Encounter (HOSPITAL_COMMUNITY)
Admission: RE | Disposition: A | Discharge status: Home / Self Care | Payer: Self-pay | Source: Ambulatory Visit | Attending: Gastroenterology

## 2014-06-24 ENCOUNTER — Ambulatory Visit (HOSPITAL_COMMUNITY)
Admission: RE | Admit: 2014-06-24 | Discharge: 2014-06-24 | Disposition: A | Discharge status: Home / Self Care | Payer: Medicare Other | Source: Ambulatory Visit | Attending: Gastroenterology | Admitting: Gastroenterology

## 2014-06-24 DIAGNOSIS — Z880 Allergy status to penicillin: Secondary | ICD-10-CM | POA: Diagnosis not present

## 2014-06-24 DIAGNOSIS — R1310 Dysphagia, unspecified: Secondary | ICD-10-CM | POA: Diagnosis present

## 2014-06-24 DIAGNOSIS — Z881 Allergy status to other antibiotic agents status: Secondary | ICD-10-CM | POA: Insufficient documentation

## 2014-06-24 DIAGNOSIS — E785 Hyperlipidemia, unspecified: Secondary | ICD-10-CM | POA: Diagnosis not present

## 2014-06-24 DIAGNOSIS — Z882 Allergy status to sulfonamides status: Secondary | ICD-10-CM | POA: Diagnosis not present

## 2014-06-24 DIAGNOSIS — K449 Diaphragmatic hernia without obstruction or gangrene: Secondary | ICD-10-CM | POA: Diagnosis not present

## 2014-06-24 DIAGNOSIS — K219 Gastro-esophageal reflux disease without esophagitis: Secondary | ICD-10-CM | POA: Insufficient documentation

## 2014-06-24 DIAGNOSIS — Z885 Allergy status to narcotic agent status: Secondary | ICD-10-CM | POA: Insufficient documentation

## 2014-06-24 HISTORY — PX: ESOPHAGEAL MANOMETRY: SHX5429

## 2014-06-24 SURGERY — MANOMETRY, ESOPHAGUS

## 2014-06-24 MED ORDER — LIDOCAINE VISCOUS 2 % MT SOLN
OROMUCOSAL | Status: AC
  Filled 2014-06-24: qty 15

## 2014-06-24 SURGICAL SUPPLY — 2 items
FACESHIELD LNG OPTICON STERILE (SAFETY) IMPLANT
GLOVE BIO SURGEON STRL SZ8 (GLOVE) ×4 IMPLANT

## 2014-06-24 NOTE — H&P (Signed)
  Problem: Esophageal dysphagia.  History: The patient is a 68 year old female born Mar 22, 1946. On 06/10/2014 the patient underwent a diagnostic esophagogastroduodenoscopy to evaluate chronic esophageal dysphagia; her esophagogastroduodenoscopy showed a moderate sized hiatal hernia but was otherwise normal.The squamocolumnar junction was noted at 36 cm from the incisor teeth.  The patient is scheduled to undergo high-resolution esophageal manometry today.  Past medical history: Hypercholesterolemia. Migraine headaches syndrome. Hypertension. Squamous cell carcinoma of the skin. Gastroesophageal reflux. Cervical and lumbar laminectomy. Left bunionectomy. Cholecystectomy. Tonsillectomy. Facelift.  Medication allergies: Penicillin. Tetracycline. Morphine. Sulfa.  Plan: Proceed with high-resolution esophageal manometry to evaluate chronic esophageal dysphagia.

## 2014-06-25 ENCOUNTER — Encounter (HOSPITAL_COMMUNITY): Payer: Self-pay | Admitting: Gastroenterology

## 2014-07-18 ENCOUNTER — Ambulatory Visit: Payer: Medicare Other | Attending: Orthopedic Surgery | Admitting: Physical Therapy

## 2014-07-18 DIAGNOSIS — Z9889 Other specified postprocedural states: Secondary | ICD-10-CM | POA: Diagnosis not present

## 2014-07-18 DIAGNOSIS — M25519 Pain in unspecified shoulder: Secondary | ICD-10-CM | POA: Diagnosis not present

## 2014-07-18 DIAGNOSIS — I10 Essential (primary) hypertension: Secondary | ICD-10-CM | POA: Diagnosis not present

## 2014-07-18 DIAGNOSIS — M25619 Stiffness of unspecified shoulder, not elsewhere classified: Secondary | ICD-10-CM | POA: Insufficient documentation

## 2014-07-18 DIAGNOSIS — R609 Edema, unspecified: Secondary | ICD-10-CM | POA: Insufficient documentation

## 2014-07-18 DIAGNOSIS — IMO0001 Reserved for inherently not codable concepts without codable children: Secondary | ICD-10-CM | POA: Insufficient documentation

## 2014-07-22 ENCOUNTER — Ambulatory Visit: Payer: Medicare Other | Admitting: Physical Therapy

## 2014-07-22 DIAGNOSIS — IMO0001 Reserved for inherently not codable concepts without codable children: Secondary | ICD-10-CM | POA: Diagnosis not present

## 2014-07-26 ENCOUNTER — Ambulatory Visit: Payer: Medicare Other | Admitting: Physical Therapy

## 2014-07-26 DIAGNOSIS — IMO0001 Reserved for inherently not codable concepts without codable children: Secondary | ICD-10-CM | POA: Diagnosis not present

## 2014-08-02 ENCOUNTER — Ambulatory Visit: Payer: Medicare Other | Attending: Orthopedic Surgery | Admitting: Physical Therapy

## 2014-08-02 DIAGNOSIS — M25611 Stiffness of right shoulder, not elsewhere classified: Secondary | ICD-10-CM | POA: Diagnosis not present

## 2014-08-02 DIAGNOSIS — R609 Edema, unspecified: Secondary | ICD-10-CM | POA: Insufficient documentation

## 2014-08-02 DIAGNOSIS — I10 Essential (primary) hypertension: Secondary | ICD-10-CM | POA: Diagnosis not present

## 2014-08-02 DIAGNOSIS — M25511 Pain in right shoulder: Secondary | ICD-10-CM | POA: Diagnosis present

## 2014-08-02 DIAGNOSIS — Z9889 Other specified postprocedural states: Secondary | ICD-10-CM | POA: Insufficient documentation

## 2014-08-06 ENCOUNTER — Ambulatory Visit: Payer: Medicare Other | Admitting: Physical Therapy

## 2014-08-06 DIAGNOSIS — M25511 Pain in right shoulder: Secondary | ICD-10-CM | POA: Diagnosis not present

## 2014-08-09 ENCOUNTER — Ambulatory Visit: Payer: Medicare Other | Admitting: Physical Therapy

## 2014-08-09 DIAGNOSIS — M25511 Pain in right shoulder: Secondary | ICD-10-CM | POA: Diagnosis not present

## 2014-08-13 ENCOUNTER — Ambulatory Visit: Payer: Medicare Other | Admitting: Physical Therapy

## 2014-08-13 DIAGNOSIS — M25511 Pain in right shoulder: Secondary | ICD-10-CM | POA: Diagnosis not present

## 2014-08-15 ENCOUNTER — Ambulatory Visit: Payer: Medicare Other | Admitting: Physical Therapy

## 2014-08-15 DIAGNOSIS — M25511 Pain in right shoulder: Secondary | ICD-10-CM | POA: Diagnosis not present

## 2014-08-16 ENCOUNTER — Ambulatory Visit: Payer: Medicare Other | Admitting: Physical Therapy

## 2014-08-19 ENCOUNTER — Ambulatory Visit: Payer: Medicare Other | Admitting: Physical Therapy

## 2014-08-19 DIAGNOSIS — M25511 Pain in right shoulder: Secondary | ICD-10-CM | POA: Diagnosis not present

## 2014-08-23 ENCOUNTER — Ambulatory Visit: Payer: Medicare Other | Admitting: Physical Therapy

## 2014-08-26 ENCOUNTER — Ambulatory Visit: Payer: Medicare Other | Admitting: Physical Therapy

## 2014-08-30 ENCOUNTER — Ambulatory Visit: Payer: Medicare Other | Admitting: Physical Therapy

## 2014-09-02 ENCOUNTER — Ambulatory Visit: Payer: Medicare Other | Attending: Orthopedic Surgery | Admitting: Physical Therapy

## 2014-09-02 DIAGNOSIS — I10 Essential (primary) hypertension: Secondary | ICD-10-CM | POA: Diagnosis not present

## 2014-09-02 DIAGNOSIS — M25511 Pain in right shoulder: Secondary | ICD-10-CM | POA: Diagnosis not present

## 2014-09-02 DIAGNOSIS — R609 Edema, unspecified: Secondary | ICD-10-CM | POA: Insufficient documentation

## 2014-09-02 DIAGNOSIS — M25611 Stiffness of right shoulder, not elsewhere classified: Secondary | ICD-10-CM | POA: Insufficient documentation

## 2014-09-02 DIAGNOSIS — Z9889 Other specified postprocedural states: Secondary | ICD-10-CM | POA: Diagnosis not present

## 2014-09-06 ENCOUNTER — Ambulatory Visit: Payer: Medicare Other | Admitting: Physical Therapy

## 2014-09-06 DIAGNOSIS — M25511 Pain in right shoulder: Secondary | ICD-10-CM | POA: Diagnosis not present

## 2014-09-09 ENCOUNTER — Ambulatory Visit: Payer: Medicare Other | Admitting: Physical Therapy

## 2014-09-09 DIAGNOSIS — M25511 Pain in right shoulder: Secondary | ICD-10-CM | POA: Diagnosis not present

## 2014-09-13 ENCOUNTER — Ambulatory Visit: Payer: Medicare Other | Admitting: Physical Therapy

## 2014-09-13 DIAGNOSIS — M25511 Pain in right shoulder: Secondary | ICD-10-CM | POA: Diagnosis not present

## 2014-09-16 ENCOUNTER — Ambulatory Visit: Payer: Medicare Other | Admitting: Physical Therapy

## 2014-09-16 DIAGNOSIS — M25511 Pain in right shoulder: Secondary | ICD-10-CM | POA: Diagnosis not present

## 2014-09-18 ENCOUNTER — Encounter: Payer: Self-pay | Admitting: Neurology

## 2014-09-20 ENCOUNTER — Ambulatory Visit: Payer: Medicare Other | Admitting: Physical Therapy

## 2014-09-20 DIAGNOSIS — M25511 Pain in right shoulder: Secondary | ICD-10-CM | POA: Diagnosis not present

## 2014-09-24 ENCOUNTER — Encounter: Payer: Self-pay | Admitting: Neurology

## 2014-09-30 ENCOUNTER — Ambulatory Visit: Payer: Medicare Other | Admitting: Physical Therapy

## 2014-09-30 DIAGNOSIS — M25511 Pain in right shoulder: Secondary | ICD-10-CM | POA: Diagnosis not present

## 2014-10-04 ENCOUNTER — Ambulatory Visit: Payer: Medicare Other | Attending: Orthopedic Surgery | Admitting: Physical Therapy

## 2014-10-04 DIAGNOSIS — Z9889 Other specified postprocedural states: Secondary | ICD-10-CM | POA: Insufficient documentation

## 2014-10-04 DIAGNOSIS — M25611 Stiffness of right shoulder, not elsewhere classified: Secondary | ICD-10-CM | POA: Insufficient documentation

## 2014-10-04 DIAGNOSIS — I10 Essential (primary) hypertension: Secondary | ICD-10-CM | POA: Insufficient documentation

## 2014-10-04 DIAGNOSIS — M25511 Pain in right shoulder: Secondary | ICD-10-CM | POA: Insufficient documentation

## 2014-10-04 DIAGNOSIS — R609 Edema, unspecified: Secondary | ICD-10-CM | POA: Insufficient documentation

## 2014-10-07 ENCOUNTER — Ambulatory Visit: Payer: Medicare Other | Admitting: Physical Therapy

## 2014-10-07 DIAGNOSIS — M25511 Pain in right shoulder: Secondary | ICD-10-CM | POA: Diagnosis not present

## 2014-10-11 ENCOUNTER — Ambulatory Visit: Payer: Medicare Other | Admitting: Physical Therapy

## 2014-10-11 DIAGNOSIS — M25511 Pain in right shoulder: Secondary | ICD-10-CM | POA: Diagnosis not present

## 2014-12-19 ENCOUNTER — Telehealth: Payer: Self-pay | Admitting: Gastroenterology

## 2014-12-19 NOTE — Telephone Encounter (Signed)
previous GI records reviewed. Told pt per Dr. Fuller Plan, she would need to be seen at [facility] or [facility] (esophageal specialist)  Will shread records.

## 2015-11-11 ENCOUNTER — Encounter: Payer: Self-pay | Admitting: Physical Therapy

## 2015-11-11 ENCOUNTER — Ambulatory Visit: Payer: Medicare Other | Attending: Physical Medicine and Rehabilitation | Admitting: Physical Therapy

## 2015-11-11 DIAGNOSIS — R262 Difficulty in walking, not elsewhere classified: Secondary | ICD-10-CM | POA: Diagnosis present

## 2015-11-11 DIAGNOSIS — M5442 Lumbago with sciatica, left side: Secondary | ICD-10-CM | POA: Diagnosis not present

## 2015-11-11 NOTE — Therapy (Signed)
[address] [place], [place], [address] Phone: [phone]   Fax:  [phone]  Physical Therapy Evaluation  Patient Details  Name: Carrie Drake MRN: [id number] Date of Birth: 11-17-1945 Referring Provider: penny jones  Encounter Date: 11/11/2015      PT End of Session - 11/11/15 1118    Visit Number 1   Date for PT Re-Evaluation 01/09/16   PT Start Time [id number]   PT Stop Time 1143   PT Time Calculation (min) 56 min   Activity Tolerance Patient tolerated treatment well   Behavior During Therapy [facility] for tasks assessed/performed      Past Medical History  Diagnosis Date  . Hypothyroidism   . 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 GERD (gastroesophageal reflux disease)   . Right rotator cuff tear   . Tobacco use disorder   . Other and unspecified hyperlipidemia   . Migraine, unspecified, without mention of intractable migraine without mention of status migrainosus   . Unspecified disorder of skin and subcutaneous tissue   . Backache, unspecified   . Lumbago 11/07/2013  . Complication of anesthesia     nausea & vomiting from Morphine    Past Surgical History  Procedure Laterality Date  . Cholecystectomy  yrs ago  . Left foot bunion removed  yrs ago  . Cervical neck surgery for c 2 fx  1997    plate inserted, limited neck motion to left  . Back surgery  yrs ago    lower back surgery x 2  . Shoulder open rotator cuff repair Right 04/11/2013    Procedure: RIGHT ROTATOR CUFF REPAIR SHOULDER OPEN, ACROMIONECTOMY w/ GRAFT;  Surgeon: Tobi Bastos, MD;  [facility];  Service: Orthopedics;  Laterality: Right;  . Laminectomy    . Cosmetic surgery      FACE LIFT 2012  . Tonsillectomy    . Esophagogastroduodenoscopy (egd) with propofol N/A 06/10/2014    Procedure: ESOPHAGOGASTRODUODENOSCOPY (EGD) WITH PROPOFOL;  Surgeon: Garlan Fair, MD;  [facility];  Service:  Endoscopy;  Laterality: N/A;  . Colonoscopy with propofol N/A 06/10/2014    Procedure: COLONOSCOPY WITH PROPOFOL;  Surgeon: Garlan Fair, MD;  [facility];  Service: Endoscopy;  Laterality: N/A;  . Esophageal manometry N/A 06/24/2014    Procedure: ESOPHAGEAL MANOMETRY (EM);  Surgeon: Garlan Fair, MD;  [facility];  Service: Endoscopy;  Laterality: N/A;    There were no vitals filed for this visit.  Visit Diagnosis:  Bilateral low back pain with left-sided sciatica - Plan: PT plan of care cert/re-cert  Difficulty walking - Plan: PT plan of care cert/re-cert      Subjective Assessment - 11/11/15 1050    Subjective Patient presents with LBP, had two lumbar surgeries with the most recent being early 1990's.  She reports that this episode she does not remember any specific incident.  Some disc bulging in the lower lumbar area.  She was walking 2 miles up until November   Limitations Standing;Lifting;Walking   Patient Stated Goals have less pain and walk again   Currently in Pain? Yes   Pain Score 8    Pain Location Back   Pain Orientation Left;Lower   Pain Descriptors / Indicators Aching;Sore;Spasm;Tightness   Pain Radiating Towards some reports of pain in the front of the left thigh to the knee   Pain Onset More than a month ago  Pain Frequency Constant   Aggravating Factors  walking, bending, trying to do ADL's pain up to 10/10   Pain Relieving Factors use of back brace, rest at best a 6-7/10   Effect of Pain on Daily Activities cannot perform ADL's. Difficulty with walking and shopping.            [facility] PT Assessment - 11/11/15 0001    Assessment   Medical Diagnosis LBP   Referring Provider penny jones   Onset Date/Surgical Date 11/04/15   Prior Therapy years ago after surgery   Precautions   Precautions None   Balance Screen   Has the patient fallen in the past 6 months No   Has the patient had a decrease in activity level because of a fear  of falling?  No   Is the patient reluctant to leave their home because of a fear of falling?  No   Home Environment   Additional Comments does housework, has a dog she walks   Prior Function   Level of Independence Independent   Vocation Retired   Biomedical scientist wan an Therapist, sports   Leisure walks 3x/week   Posture/Postural Control   Posture Comments fwd head, rounded shoulders   AROM   Overall AROM Comments Lumbar ROM was decreased 25% for flexion with difficulty getting back up, other motions decreased 75% with pain   Strength   Overall Strength Comments 3+/5 for the LE's with some pain in the back   Flexibility   Soft Tissue Assessment /Muscle Length --  tight calves and very tight piriformis mms   Palpation   Palpation comment very tight in the lumbar area and into the SI, some spasms in the rhomboid and upper traps   Special Tests    Special Tests --  + slump on the left, less pain with manual traction                   [facility] Adult PT Treatment/Exercise - 11/11/15 0001    Modalities   Modalities Electrical Stimulation;Moist Heat   Moist Heat Therapy   Number Minutes Moist Heat 15 Minutes   Moist Heat Location Lumbar Spine   Electrical Stimulation   Electrical Stimulation Location lumbar spine   Electrical Stimulation Action IFC   Electrical Stimulation Parameters in sitting   Electrical Stimulation Goals Pain                  PT Short Term Goals - 11/11/15 1124    PT SHORT TERM GOAL #1   Title independent with initial HEP   Time 2   Period Weeks   Status New           PT Long Term Goals - 11/11/15 1124    PT LONG TERM GOAL #1   Title understand proper posture and body mechanics   Time 8   Period Weeks   Status New   PT LONG TERM GOAL #2   Title increase lumbar ROM 25%   Time 8   Period Weeks   Status New   PT LONG TERM GOAL #3   Title decrease pain 50%   Time 8   Period Weeks   Status New   PT LONG TERM GOAL #4   Title  tolerate grocery shopping without difficulkty   Time 8   Period Weeks   Status New               Plan - 11/11/15 1120    Clinical  Impression Statement Patient with low back pain and sciatica type symptoms into the left leg.  She has a history of 2 surgeries, her pain started about a month ago and rating of pain is very high with inability to control, she is on pain patches now, has diagnosis of DDD, facet arthropathy and stenosis.  She had some relief with manual traction.  + slump test on the left.  Inability to walk much more than 200 feet due to pain   Pt will benefit from skilled therapeutic intervention in order to improve on the following deficits Decreased activity tolerance;Decreased strength;Decreased range of motion;Difficulty walking;Impaired flexibility;Increased muscle spasms;Improper body mechanics;Postural dysfunction;Pain   Rehab Potential Good   PT Frequency 2x / week   PT Duration 8 weeks   PT Treatment/Interventions ADLs/Self Care Home Management;Electrical Stimulation;Moist Heat;Therapeutic exercise;Ultrasound;Traction;Neuromuscular re-education;Patient/family education;Manual techniques;Taping;Passive range of motion   PT Next Visit Plan slowly add flexibility and stability exercises, may try manual vs. machine traction   Consulted and Agree with Plan of Care Patient          G-Codes - 11/20/2015 1134    Functional Assessment Tool Used foto 67% limitation   Functional Limitation Other PT primary   Other PT Primary Current Status [id number]) At least 60 percent but less than 80 percent impaired, limited or restricted   Other PT Primary Goal Status [id number]) At least 40 percent but less than 60 percent impaired, limited or restricted       Problem List Patient Active Problem List   Diagnosis Date Noted  . Lumbago 11/07/2013  . Tear of rotator cuff 04/11/2013    Sumner Boast., PT 11-20-2015, 11:37 AM  [address] [place], [place], [address] Phone: [phone]   Fax:  [phone]  Name: Carrie Drake MRN: [id number] Date of Birth: 02-20-1946

## 2015-11-11 NOTE — Patient Instructions (Signed)
Knee-to-Chest: with Neck Flexion Stretch (Supine)   Pull left knee to chest, tucking chin and lifting head. Hold __10__ seconds. Relax. Repeat _10___ times per set. Do _2___ sets per session. Do __2__ sessions per day.  Trunk: Knees to Chest   Lie on firm, flat surface. Keep head and shoulders flat on surface. Tuck hands behind knees and pull to chest. Hold _10___ seconds. Repeat __10__ times. Do _2___ sessions per day. CAUTION: Movement should be gentle and slow.  Caudal Rotation: Hip Roll, Neutral Lordosis - Supine   Lie with knees bent and slightly elevated, feet flat. Tighten stomach, lower knees out to right side, rotating hips and trunk. Keep stomach tight for return. Repeat _10___ times per set. Do __2__ sets per session. Do _2___ sessions per week.  Pelvic Tilt: Anterior - Legs Bent (Supine)   Rotate pelvis up and arch back. Hold ____ seconds. Relax. Repeat ____ times per set. Do ____ sets per session. Do ____ sessions per day.  Piriformis Stretch   Lying on back, pull right knee toward opposite shoulder. Hold __30__ seconds. Repeat _4___ times. Do _2___ sessions per day.  

## 2015-11-13 ENCOUNTER — Ambulatory Visit: Payer: Medicare Other | Admitting: Physical Therapy

## 2015-11-13 ENCOUNTER — Encounter: Payer: Self-pay | Admitting: Physical Therapy

## 2015-11-13 DIAGNOSIS — M5442 Lumbago with sciatica, left side: Secondary | ICD-10-CM | POA: Diagnosis not present

## 2015-11-13 DIAGNOSIS — R262 Difficulty in walking, not elsewhere classified: Secondary | ICD-10-CM

## 2015-11-13 NOTE — Therapy (Signed)
[address] [place], [place], [address] Phone: [phone]   Fax:  [phone]  Physical Therapy Treatment  Patient Details  Name: Carrie Drake MRN: [id number] Date of Birth: 08-18-1946 Referring Provider: penny jones  Encounter Date: 11/13/2015      PT End of Session - 11/13/15 1314    Visit Number 2   Date for PT Re-Evaluation 01/09/16   PT Start Time 1053   PT Stop Time 1142   PT Time Calculation (min) 49 min   Activity Tolerance Patient tolerated treatment well   Behavior During Therapy [facility] for tasks assessed/performed      Past Medical History  Diagnosis Date  . Hypothyroidism   . 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 GERD (gastroesophageal reflux disease)   . Right rotator cuff tear   . Tobacco use disorder   . Other and unspecified hyperlipidemia   . Migraine, unspecified, without mention of intractable migraine without mention of status migrainosus   . Unspecified disorder of skin and subcutaneous tissue   . Backache, unspecified   . Lumbago 11/07/2013  . Complication of anesthesia     nausea & vomiting from Morphine    Past Surgical History  Procedure Laterality Date  . Cholecystectomy  yrs ago  . Left foot bunion removed  yrs ago  . Cervical neck surgery for c 2 fx  1997    plate inserted, limited neck motion to left  . Back surgery  yrs ago    lower back surgery x 2  . Shoulder open rotator cuff repair Right 04/11/2013    Procedure: RIGHT ROTATOR CUFF REPAIR SHOULDER OPEN, ACROMIONECTOMY w/ GRAFT;  Surgeon: Tobi Bastos, MD;  [facility];  Service: Orthopedics;  Laterality: Right;  . Laminectomy    . Cosmetic surgery      FACE LIFT 2012  . Tonsillectomy    . Esophagogastroduodenoscopy (egd) with propofol N/A 06/10/2014    Procedure: ESOPHAGOGASTRODUODENOSCOPY (EGD) WITH PROPOFOL;  Surgeon: Garlan Fair, MD;  [facility];  Service:  Endoscopy;  Laterality: N/A;  . Colonoscopy with propofol N/A 06/10/2014    Procedure: COLONOSCOPY WITH PROPOFOL;  Surgeon: Garlan Fair, MD;  [facility];  Service: Endoscopy;  Laterality: N/A;  . Esophageal manometry N/A 06/24/2014    Procedure: ESOPHAGEAL MANOMETRY (EM);  Surgeon: Garlan Fair, MD;  [facility];  Service: Endoscopy;  Laterality: N/A;    There were no vitals filed for this visit.  Visit Diagnosis:  Bilateral low back pain with left-sided sciatica  Difficulty walking      Subjective Assessment - 11/13/15 1058    Subjective Reports that she had no change of pain after last visit, she reports some pain iwth HEP but was twisting neck,   Currently in Pain? Yes   Pain Score 6    Pain Location Back   Pain Orientation Left;Lower   Pain Descriptors / Indicators Aching;Spasm;Tightness;Sore   Pain Type Chronic pain                         [facility] Adult PT Treatment/Exercise - 11/13/15 0001    Lumbar Exercises: Aerobic   Tread Mill Nu Step Level 4 x 5 minutes   Lumbar Exercises: Machines for Strengthening   Other Lumbar Machine Exercise seated row and lats 15# 2x10 each   Lumbar Exercises: Supine   Other  Supine Lumbar Exercises feet on ball K2C, trunk rotation, small bridges, isometric abdominal contractions   Manual Therapy   Manual Therapy Soft tissue mobilization;Passive ROM   Soft tissue mobilization to the right SI and lumbar area and into the buttock   Passive ROM PROM of bilateral LE's                  PT Short Term Goals - 11/11/15 1124    PT SHORT TERM GOAL #1   Title independent with initial HEP   Time 2   Period Weeks   Status New           PT Long Term Goals - 11/11/15 1124    PT LONG TERM GOAL #1   Title understand proper posture and body mechanics   Time 8   Period Weeks   Status New   PT LONG TERM GOAL #2   Title increase lumbar ROM 25%   Time 8   Period Weeks   Status New   PT  LONG TERM GOAL #3   Title decrease pain 50%   Time 8   Period Weeks   Status New   PT LONG TERM GOAL #4   Title tolerate grocery shopping without difficulkty   Time 8   Period Weeks   Status New               Plan - 11/13/15 1314    Clinical Impression Statement Has knots and tenderness in the bilateral lumbar area and in the SI and buttock areas.  Was having spasms today   PT Next Visit Plan slowly add flexibility and stability exercises, may try manual vs. machine traction   Consulted and Agree with Plan of Care Patient        Problem List Patient Active Problem List   Diagnosis Date Noted  . Lumbago 11/07/2013  . Tear of rotator cuff 04/11/2013    Sumner Boast., PT 11/13/2015, 1:17 PM  [address] [place], [place], [address] Phone: [phone]   Fax:  [phone]  Name: Carrie Drake MRN: [id number] Date of Birth: 1946/09/12

## 2015-11-18 ENCOUNTER — Ambulatory Visit: Payer: Medicare Other | Admitting: Physical Therapy

## 2015-11-18 ENCOUNTER — Encounter: Payer: Self-pay | Admitting: Physical Therapy

## 2015-11-18 DIAGNOSIS — M5442 Lumbago with sciatica, left side: Secondary | ICD-10-CM

## 2015-11-18 DIAGNOSIS — R262 Difficulty in walking, not elsewhere classified: Secondary | ICD-10-CM

## 2015-11-18 NOTE — Therapy (Signed)
[address] [place], [place], [address] Phone: [phone]   Fax:  [phone]  Physical Therapy Treatment  Patient Details  Name: Carrie Drake MRN: [id number] Date of Birth: [birth year] Referring Provider: penny jones  Encounter Date: 11/18/2015      PT End of Session - 11/18/15 1203    Visit Number 3   Date for PT Re-Evaluation 01/09/16   PT Start Time 1055   PT Stop Time 1155   PT Time Calculation (min) 60 min   Activity Tolerance Patient tolerated treatment well   Behavior During Therapy [facility] for tasks assessed/performed      Past Medical History  Diagnosis Date  . Hypothyroidism   . 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 GERD (gastroesophageal reflux disease)   . Right rotator cuff tear   . Tobacco use disorder   . Other and unspecified hyperlipidemia   . Migraine, unspecified, without mention of intractable migraine without mention of status migrainosus   . Unspecified disorder of skin and subcutaneous tissue   . Backache, unspecified   . Lumbago 11/07/2013  . Complication of anesthesia     nausea & vomiting from Morphine    Past Surgical History  Procedure Laterality Date  . Cholecystectomy  yrs ago  . Left foot bunion removed  yrs ago  . Cervical neck surgery for c 2 fx  1997    plate inserted, limited neck motion to left  . Back surgery  yrs ago    lower back surgery x 2  . Shoulder open rotator cuff repair Right 04/11/2013    Procedure: RIGHT ROTATOR CUFF REPAIR SHOULDER OPEN, ACROMIONECTOMY w/ GRAFT;  Surgeon: Tobi Bastos, MD;  [facility];  Service: Orthopedics;  Laterality: Right;  . Laminectomy    . Cosmetic surgery      FACE LIFT 2012  . Tonsillectomy    . Esophagogastroduodenoscopy (egd) with propofol N/A 06/10/2014    Procedure: ESOPHAGOGASTRODUODENOSCOPY (EGD) WITH PROPOFOL;  Surgeon: Garlan Fair, MD;  [facility];  Service:  Endoscopy;  Laterality: N/A;  . Colonoscopy with propofol N/A 06/10/2014    Procedure: COLONOSCOPY WITH PROPOFOL;  Surgeon: Garlan Fair, MD;  [facility];  Service: Endoscopy;  Laterality: N/A;  . Esophageal manometry N/A 06/24/2014    Procedure: ESOPHAGEAL MANOMETRY (EM);  Surgeon: Garlan Fair, MD;  [facility];  Service: Endoscopy;  Laterality: N/A;    There were no vitals filed for this visit.  Visit Diagnosis:  Bilateral low back pain with left-sided sciatica  Difficulty walking      Subjective Assessment - 11/18/15 1055    Subjective Patient reports that the STM to the low back was good but that she is pretty sore in the low back.   Currently in Pain? Yes   Pain Score 4    Pain Location Back   Pain Orientation Left;Lower   Pain Descriptors / Indicators Aching;Sore   Pain Type Chronic pain   Aggravating Factors  ADL's   Pain Relieving Factors The STM helped                         [facility] Adult PT Treatment/Exercise - 11/18/15 0001    Lumbar Exercises: Aerobic   Tread Mill Nu Step Level 4 x 5 minutes   Lumbar Exercises: Machines for Strengthening   Other Lumbar Machine Exercise seated  row and lats 15# 2x10 each   Lumbar Exercises: Supine   Other Supine Lumbar Exercises feet on ball K2C, trunk rotation, small bridges, isometric abdominal contractions   Moist Heat Therapy   Number Minutes Moist Heat 15 Minutes   Moist Heat Location Lumbar Spine   Electrical Stimulation   Electrical Stimulation Location lumbar spine   Electrical Stimulation Action IFC   Electrical Stimulation Parameters sitting   Electrical Stimulation Goals Pain   Manual Therapy   Manual Therapy Soft tissue mobilization;Passive ROM   Manual therapy comments also did manual sheet traction   Soft tissue mobilization to the right SI and lumbar area and into the buttock   Passive ROM PROM of bilateral LE's                  PT Short Term Goals -  11/11/15 1124    PT SHORT TERM GOAL #1   Title independent with initial HEP   Time 2   Period Weeks   Status New           PT Long Term Goals - 11/11/15 1124    PT LONG TERM GOAL #1   Title understand proper posture and body mechanics   Time 8   Period Weeks   Status New   PT LONG TERM GOAL #2   Title increase lumbar ROM 25%   Time 8   Period Weeks   Status New   PT LONG TERM GOAL #3   Title decrease pain 50%   Time 8   Period Weeks   Status New   PT LONG TERM GOAL #4   Title tolerate grocery shopping without difficulkty   Time 8   Period Weeks   Status New               Plan - 11/18/15 1203    Clinical Impression Statement Had difficulty with sit fit exercises as far as a lack of control and coordination.  She remains very tender just below the iliac crest and the PSIS   PT Next Visit Plan slowly add flexibility and stability exercises   Consulted and Agree with Plan of Care Patient        Problem List Patient Active Problem List   Diagnosis Date Noted  . Lumbago 11/07/2013  . Tear of rotator cuff 04/11/2013    Sumner Boast., PT 11/18/2015, 12:06 PM  [address] [place], [place], [address] Phone: [phone]   Fax:  [phone]  Name: Carrie Drake MRN: [id number] Date of Birth: 1946-10-20

## 2015-11-20 ENCOUNTER — Encounter: Payer: Self-pay | Admitting: Physical Therapy

## 2015-11-20 ENCOUNTER — Ambulatory Visit: Payer: Medicare Other | Admitting: Physical Therapy

## 2015-11-20 DIAGNOSIS — R262 Difficulty in walking, not elsewhere classified: Secondary | ICD-10-CM

## 2015-11-20 DIAGNOSIS — M5442 Lumbago with sciatica, left side: Secondary | ICD-10-CM | POA: Diagnosis not present

## 2015-11-20 NOTE — Therapy (Signed)
[address] [place], [place], [address] Phone: [phone]   Fax:  [phone]  Physical Therapy Treatment  Patient Details  Name: Carrie Drake MRN: [id number] Date of Birth: October 10, 1946 Referring Provider: penny jones  Encounter Date: 11/20/2015      PT End of Session - 11/20/15 1147    Visit Number 4   Date for PT Re-Evaluation 01/09/16   PT Start Time [id number]   PT Stop Time 1155   PT Time Calculation (min) 63 min   Activity Tolerance Patient limited by pain   Behavior During Therapy [facility] for tasks assessed/performed      Past Medical History  Diagnosis Date  . Hypothyroidism   . 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 GERD (gastroesophageal reflux disease)   . Right rotator cuff tear   . Tobacco use disorder   . Other and unspecified hyperlipidemia   . Migraine, unspecified, without mention of intractable migraine without mention of status migrainosus   . Unspecified disorder of skin and subcutaneous tissue   . Backache, unspecified   . Lumbago 11/07/2013  . Complication of anesthesia     nausea & vomiting from Morphine    Past Surgical History  Procedure Laterality Date  . Cholecystectomy  yrs ago  . Left foot bunion removed  yrs ago  . Cervical neck surgery for c 2 fx  1997    plate inserted, limited neck motion to left  . Back surgery  yrs ago    lower back surgery x 2  . Shoulder open rotator cuff repair Right 04/11/2013    Procedure: RIGHT ROTATOR CUFF REPAIR SHOULDER OPEN, ACROMIONECTOMY w/ GRAFT;  Surgeon: Tobi Bastos, MD;  [facility];  Service: Orthopedics;  Laterality: Right;  . Laminectomy    . Cosmetic surgery      FACE LIFT 2012  . Tonsillectomy    . Esophagogastroduodenoscopy (egd) with propofol N/A 06/10/2014    Procedure: ESOPHAGOGASTRODUODENOSCOPY (EGD) WITH PROPOFOL;  Surgeon: Garlan Fair, MD;  [facility];  Service: Endoscopy;   Laterality: N/A;  . Colonoscopy with propofol N/A 06/10/2014    Procedure: COLONOSCOPY WITH PROPOFOL;  Surgeon: Garlan Fair, MD;  [facility];  Service: Endoscopy;  Laterality: N/A;  . Esophageal manometry N/A 06/24/2014    Procedure: ESOPHAGEAL MANOMETRY (EM);  Surgeon: Garlan Fair, MD;  [facility];  Service: Endoscopy;  Laterality: N/A;    There were no vitals filed for this visit.  Visit Diagnosis:  Bilateral low back pain with left-sided sciatica  Difficulty walking      Subjective Assessment - 11/20/15 1056    Subjective Reports that she was in the car a lot yesterday, having some increased pain in the low back todya   Currently in Pain? Yes   Pain Score 6    Pain Location Back   Pain Orientation Left;Lower   Pain Descriptors / Indicators Aching;Sore   Pain Type Chronic pain   Pain Onset More than a month ago   Pain Frequency Constant                         [facility] Adult PT Treatment/Exercise - 11/20/15 0001    Lumbar Exercises: Aerobic   Tread Mill Nu Step Level 4 x 5 minutes   Lumbar Exercises: Machines for Strengthening   Other Lumbar Machine Exercise seated row and  lats 15# 2x10 each   Other Lumbar Machine Exercise red tband hip extension and abduction, needed to break and rest with this   Lumbar Exercises: Supine   Other Supine Lumbar Exercises feet on ball K2C, trunk rotation, small bridges, isometric abdominal contractions   Lumbar Exercises: Sidelying   Other Sidelying Lumbar Exercises seated on sit fit, pelvic mobility and stability, weighted ball trunk rotation and trunk overhead raise.   Moist Heat Therapy   Number Minutes Moist Heat 15 Minutes   Moist Heat Location Lumbar Spine   Electrical Stimulation   Electrical Stimulation Location lumbar spine   Electrical Stimulation Action IFC   Electrical Stimulation Parameters sitting   Electrical Stimulation Goals Pain   Manual Therapy   Manual Therapy Soft tissue  mobilization;Joint mobilization;Manual Traction   Soft tissue mobilization to the right SI and lumbar area and into the buttock   Passive ROM PROM of bilateral LE's   Manual Traction manual right LE distraction, manual sacral release                  PT Short Term Goals - 11/20/15 1148    PT SHORT TERM GOAL #1   Title independent with initial HEP   Status Achieved           PT Long Term Goals - 11/11/15 1124    PT LONG TERM GOAL #1   Title understand proper posture and body mechanics   Time 8   Period Weeks   Status New   PT LONG TERM GOAL #2   Title increase lumbar ROM 25%   Time 8   Period Weeks   Status New   PT LONG TERM GOAL #3   Title decrease pain 50%   Time 8   Period Weeks   Status New   PT LONG TERM GOAL #4   Title tolerate grocery shopping without difficulkty   Time 8   Period Weeks   Status New               Plan - 11/20/15 1147    Clinical Impression Statement Patient with some increased spasms in the right low back at times, had difficulty with red tband hip exercises.  The knots in the SI area are still very tender.   PT Next Visit Plan slowly add flexibility and stability exercises   Consulted and Agree with Plan of Care Patient        Problem List Patient Active Problem List   Diagnosis Date Noted  . Lumbago 11/07/2013  . Tear of rotator cuff 04/11/2013    Sumner Boast., PT 11/20/2015, 11:49 AM  [address] [place], [place], [address] Phone: [phone]   Fax:  [phone]  Name: EVALYN EIGSTI MRN: [id number] Date of Birth: April 23, 1946

## 2015-11-24 ENCOUNTER — Encounter: Payer: Self-pay | Admitting: Physical Therapy

## 2015-11-24 ENCOUNTER — Ambulatory Visit: Payer: Medicare Other | Admitting: Physical Therapy

## 2015-11-24 DIAGNOSIS — R262 Difficulty in walking, not elsewhere classified: Secondary | ICD-10-CM

## 2015-11-24 DIAGNOSIS — M5442 Lumbago with sciatica, left side: Secondary | ICD-10-CM | POA: Diagnosis not present

## 2015-11-24 NOTE — Therapy (Signed)
[place] [address] [place], [place], [address] Phone: [phone]   Fax:  [phone]  Physical Therapy Treatment  Patient Details  Name: Carrie Drake MRN: [id number] Date of Birth: [birth year] Referring Provider: penny jones  Encounter Date: 11/24/2015      PT End of Session - 11/24/15 1522    Visit Number 5   Date for PT Re-Evaluation 01/09/16   PT Start Time 1440   PT Stop Time 1536   PT Time Calculation (min) 56 min   Activity Tolerance Patient limited by pain   Behavior During Therapy [facility] for tasks assessed/performed      Past Medical History  Diagnosis Date  . Hypothyroidism   . 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 GERD (gastroesophageal reflux disease)   . Right rotator cuff tear   . Tobacco use disorder   . Other and unspecified hyperlipidemia   . Migraine, unspecified, without mention of intractable migraine without mention of status migrainosus   . Unspecified disorder of skin and subcutaneous tissue   . Backache, unspecified   . Lumbago 11/07/2013  . Complication of anesthesia     nausea & vomiting from Morphine    Past Surgical History  Procedure Laterality Date  . Cholecystectomy  yrs ago  . Left foot bunion removed  yrs ago  . Cervical neck surgery for c 2 fx  1997    plate inserted, limited neck motion to left  . Back surgery  yrs ago    lower back surgery x 2  . Shoulder open rotator cuff repair Right 04/11/2013    Procedure: RIGHT ROTATOR CUFF REPAIR SHOULDER OPEN, ACROMIONECTOMY w/ GRAFT;  Surgeon: Tobi Bastos, MD;  [facility];  Service: Orthopedics;  Laterality: Right;  . Laminectomy    . Cosmetic surgery      FACE LIFT 2012  . Tonsillectomy    . Esophagogastroduodenoscopy (egd) with propofol N/A 06/10/2014    Procedure: ESOPHAGOGASTRODUODENOSCOPY (EGD) WITH PROPOFOL;  Surgeon: Garlan Fair, MD;  [facility];  Service: Endoscopy;   Laterality: N/A;  . Colonoscopy with propofol N/A 06/10/2014    Procedure: COLONOSCOPY WITH PROPOFOL;  Surgeon: Garlan Fair, MD;  [facility];  Service: Endoscopy;  Laterality: N/A;  . Esophageal manometry N/A 06/24/2014    Procedure: ESOPHAGEAL MANOMETRY (EM);  Surgeon: Garlan Fair, MD;  [facility];  Service: Endoscopy;  Laterality: N/A;    There were no vitals filed for this visit.  Visit Diagnosis:  Bilateral low back pain with left-sided sciatica  Difficulty walking      Subjective Assessment - 11/24/15 1445    Subjective Reports still hurting   Currently in Pain? Yes   Pain Score 6    Pain Location Back   Pain Orientation Left;Lower   Pain Descriptors / Indicators Aching   Pain Type Chronic pain            [facility] PT Assessment - 11/24/15 0001    AROM   Overall AROM Comments Lumbar ROM was decreased 25% for flexion with difficulty getting back up, other motions decreased 75% with pain                     [facility] Adult PT Treatment/Exercise - 11/24/15 0001    Lumbar Exercises: Aerobic   Tread Mill Nu Step Level 4 x 5 minutes   Lumbar Exercises: Machines for  Strengthening   Other Lumbar Machine Exercise seated row and lats 15# 2x10 each   Other Lumbar Machine Exercise red tband hip extension and abduction, needed to break and rest with this   Lumbar Exercises: Supine   Other Supine Lumbar Exercises feet on ball K2C, trunk rotation, small bridges, isometric abdominal contractions   Lumbar Exercises: Sidelying   Other Sidelying Lumbar Exercises seated on sit fit, pelvic mobility and stability, weighted ball trunk rotation and trunk overhead raise.   Moist Heat Therapy   Number Minutes Moist Heat 15 Minutes   Moist Heat Location Lumbar Spine   Electrical Stimulation   Electrical Stimulation Location lumbar spine   Electrical Stimulation Action IFC   Electrical Stimulation Parameters sitting   Electrical Stimulation Goals Pain    Manual Therapy   Manual Therapy Soft tissue mobilization;Joint mobilization;Manual Traction   Soft tissue mobilization to the right SI and lumbar area and into the buttock   Manual Traction manual right LE distraction, manual sacral release                  PT Short Term Goals - 11/20/15 1148    PT SHORT TERM GOAL #1   Title independent with initial HEP   Status Achieved           PT Long Term Goals - 11/11/15 1124    PT LONG TERM GOAL #1   Title understand proper posture and body mechanics   Time 8   Period Weeks   Status New   PT LONG TERM GOAL #2   Title increase lumbar ROM 25%   Time 8   Period Weeks   Status New   PT LONG TERM GOAL #3   Title decrease pain 50%   Time 8   Period Weeks   Status New   PT LONG TERM GOAL #4   Title tolerate grocery shopping without difficulkty   Time 8   Period Weeks   Status New               Plan - 11/24/15 1523    Clinical Impression Statement Patient with a type "A" personality and has a great deal of difficulty relaxing.  She is very tense most of the time.   PT Next Visit Plan may try macuhine traction next visit   Consulted and Agree with Plan of Care Patient        Problem List Patient Active Problem List   Diagnosis Date Noted  . Lumbago 11/07/2013  . Tear of rotator cuff 04/11/2013    Sumner Boast., PT 11/24/2015, 3:25 PM  [place] [address] [place], [place], [address] Phone: [phone]   Fax:  [phone]  Name: Carrie Drake MRN: [id number] Date of Birth: 06/04/1946

## 2015-11-28 ENCOUNTER — Encounter: Payer: Self-pay | Admitting: Physical Therapy

## 2015-11-28 ENCOUNTER — Ambulatory Visit: Payer: Medicare Other | Admitting: Physical Therapy

## 2015-11-28 DIAGNOSIS — M5442 Lumbago with sciatica, left side: Secondary | ICD-10-CM | POA: Diagnosis not present

## 2015-11-28 DIAGNOSIS — R262 Difficulty in walking, not elsewhere classified: Secondary | ICD-10-CM

## 2015-11-28 NOTE — Therapy (Signed)
[place] [address] [place], [place], [address] Phone: [phone]   Fax:  [phone]  Physical Therapy Treatment  Patient Details  Name: Carrie Drake MRN: [id number] Date of Birth: 10-07-1946 Referring Provider: penny jones  Encounter Date: 11/28/2015      PT End of Session - 11/28/15 1034    Visit Number 6   Date for PT Re-Evaluation 01/09/16   PT Start Time 1012   PT Stop Time 1100   PT Time Calculation (min) 48 min   Activity Tolerance Patient limited by pain   Behavior During Therapy [facility] for tasks assessed/performed      Past Medical History  Diagnosis Date  . Hypothyroidism   . 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 GERD (gastroesophageal reflux disease)   . Right rotator cuff tear   . Tobacco use disorder   . Other and unspecified hyperlipidemia   . Migraine, unspecified, without mention of intractable migraine without mention of status migrainosus   . Unspecified disorder of skin and subcutaneous tissue   . Backache, unspecified   . Lumbago 11/07/2013  . Complication of anesthesia     nausea & vomiting from Morphine    Past Surgical History  Procedure Laterality Date  . Cholecystectomy  yrs ago  . Left foot bunion removed  yrs ago  . Cervical neck surgery for c 2 fx  1997    plate inserted, limited neck motion to left  . Back surgery  yrs ago    lower back surgery x 2  . Shoulder open rotator cuff repair Right 04/11/2013    Procedure: RIGHT ROTATOR CUFF REPAIR SHOULDER OPEN, ACROMIONECTOMY w/ GRAFT;  Surgeon: Tobi Bastos, MD;  [facility];  Service: Orthopedics;  Laterality: Right;  . Laminectomy    . Cosmetic surgery      FACE LIFT 2012  . Tonsillectomy    . Esophagogastroduodenoscopy (egd) with propofol N/A 06/10/2014    Procedure: ESOPHAGOGASTRODUODENOSCOPY (EGD) WITH PROPOFOL;  Surgeon: Garlan Fair, MD;  [facility];  Service: Endoscopy;   Laterality: N/A;  . Colonoscopy with propofol N/A 06/10/2014    Procedure: COLONOSCOPY WITH PROPOFOL;  Surgeon: Garlan Fair, MD;  [facility];  Service: Endoscopy;  Laterality: N/A;  . Esophageal manometry N/A 06/24/2014    Procedure: ESOPHAGEAL MANOMETRY (EM);  Surgeon: Garlan Fair, MD;  [facility];  Service: Endoscopy;  Laterality: N/A;    There were no vitals filed for this visit.  Visit Diagnosis:  Bilateral low back pain with left-sided sciatica  Difficulty walking      Subjective Assessment - 11/28/15 1016    Subjective Just not really any better, still hurting   Currently in Pain? Yes   Pain Score 6    Pain Location Back   Pain Orientation Left;Lower   Aggravating Factors  just hurts   Pain Relieving Factors nothing really                         [facility] Adult PT Treatment/Exercise - 11/28/15 0001    Lumbar Exercises: Aerobic   Tread Mill Nu Step Level 4 x 5 minutes   Lumbar Exercises: Machines for Strengthening   Other Lumbar Machine Exercise seated row and lats 15# 2x10 each   Other Lumbar Machine Exercise red tband hip extension and abduction, needed to break and rest with this  Lumbar Exercises: Sidelying   Other Sidelying Lumbar Exercises seated on sit fit, pelvic mobility and stability, weighted ball trunk rotation and trunk overhead raise.   Modalities   Modalities Traction   Moist Heat Therapy   Number Minutes Moist Heat 15 Minutes   Moist Heat Location Lumbar Spine   Electrical Stimulation   Electrical Stimulation Location lumbar spine   Electrical Stimulation Action IFC   Electrical Stimulation Parameters sitting   Electrical Stimulation Goals Pain   Traction   Type of Traction Lumbar   Max (lbs) 35   Hold Time static   Time 12                  PT Short Term Goals - 11/20/15 1148    PT SHORT TERM GOAL #1   Title independent with initial HEP   Status Achieved           PT Long Term  Goals - 11/28/15 1037    PT LONG TERM GOAL #1   Title understand proper posture and body mechanics   Status Achieved               Plan - 11/28/15 1034    Clinical Impression Statement Patient with no real changes in her pain level, changed it up today and tried traction and ice.   PT Next Visit Plan see if any differences with new treatment today   Consulted and Agree with Plan of Care Patient        Problem List Patient Active Problem List   Diagnosis Date Noted  . Lumbago 11/07/2013  . Tear of rotator cuff 04/11/2013    Sumner Boast., PT 11/28/2015, 10:38 AM  [facility] [address] [place] [address] [place], [place], [address] Phone: [phone]   Fax:  [phone]  Name: Carrie Drake MRN: [id number] Date of Birth: 02-06-1946

## 2015-12-01 ENCOUNTER — Ambulatory Visit: Payer: Medicare Other | Admitting: Physical Therapy

## 2015-12-01 ENCOUNTER — Encounter: Payer: Self-pay | Admitting: Physical Therapy

## 2015-12-01 DIAGNOSIS — M5442 Lumbago with sciatica, left side: Secondary | ICD-10-CM

## 2015-12-01 DIAGNOSIS — R262 Difficulty in walking, not elsewhere classified: Secondary | ICD-10-CM

## 2015-12-01 NOTE — Therapy (Signed)
[place] [address] [place], [place], [address] Phone: [phone]   Fax:  [phone]  Physical Therapy Treatment  Patient Details  Name: Carrie Drake MRN: [id number] Date of Birth: 1945-11-14 Referring Provider: penny jones  Encounter Date: 12/01/2015      PT End of Session - 12/01/15 1047    Visit Number 7   Date for PT Re-Evaluation 01/09/16   PT Start Time [id number]   PT Stop Time 1100   PT Time Calculation (min) 45 min   Activity Tolerance Patient limited by pain;Patient tolerated treatment well   Behavior During Therapy [facility] for tasks assessed/performed      Past Medical History  Diagnosis Date  . Hypothyroidism   . 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 GERD (gastroesophageal reflux disease)   . Right rotator cuff tear   . Tobacco use disorder   . Other and unspecified hyperlipidemia   . Migraine, unspecified, without mention of intractable migraine without mention of status migrainosus   . Unspecified disorder of skin and subcutaneous tissue   . Backache, unspecified   . Lumbago 11/07/2013  . Complication of anesthesia     nausea & vomiting from Morphine    Past Surgical History  Procedure Laterality Date  . Cholecystectomy  yrs ago  . Left foot bunion removed  yrs ago  . Cervical neck surgery for c 2 fx  1997    plate inserted, limited neck motion to left  . Back surgery  yrs ago    lower back surgery x 2  . Shoulder open rotator cuff repair Right 04/11/2013    Procedure: RIGHT ROTATOR CUFF REPAIR SHOULDER OPEN, ACROMIONECTOMY w/ GRAFT;  Surgeon: Tobi Bastos, MD;  [facility];  Service: Orthopedics;  Laterality: Right;  . Laminectomy    . Cosmetic surgery      FACE LIFT 2012  . Tonsillectomy    . Esophagogastroduodenoscopy (egd) with propofol N/A 06/10/2014    Procedure: ESOPHAGOGASTRODUODENOSCOPY (EGD) WITH PROPOFOL;  Surgeon: Garlan Fair, MD;  [facility];  Service: Endoscopy;  Laterality: N/A;  . Colonoscopy with propofol N/A 06/10/2014    Procedure: COLONOSCOPY WITH PROPOFOL;  Surgeon: Garlan Fair, MD;  [facility];  Service: Endoscopy;  Laterality: N/A;  . Esophageal manometry N/A 06/24/2014    Procedure: ESOPHAGEAL MANOMETRY (EM);  Surgeon: Garlan Fair, MD;  [facility];  Service: Endoscopy;  Laterality: N/A;    There were no vitals filed for this visit.  Visit Diagnosis:  Difficulty walking  Bilateral low back pain with left-sided sciatica      Subjective Assessment - 12/01/15 1014    Subjective "I got the most relief from the traction, Im a little sore"   Currently in Pain? Yes   Pain Score 4    Pain Location Back                         [facility] Adult PT Treatment/Exercise - 12/01/15 0001    Lumbar Exercises: Aerobic   Tread Mill Nu Step Level 4 x 5 minutes   Lumbar Exercises: Machines for Strengthening   Other Lumbar Machine Exercise seated row and lats 15# 2x10 each   Other Lumbar Machine Exercise red tband hip extension and abduction, needed to break and rest with this   Lumbar Exercises: Sidelying   Other Sidelying Lumbar Exercises seated on  sit fit, pelvic mobility and stability, weighted ball trunk rotation and trunk overhead raise; LAQ   Modalities   Modalities Traction   Moist Heat Therapy   Number Minutes Moist Heat 15 Minutes   Moist Heat Location Lumbar Spine   Traction   Type of Traction Lumbar   Max (lbs) 35   Hold Time static   Time 13                  PT Short Term Goals - 11/20/15 1148    PT SHORT TERM GOAL #1   Title independent with initial HEP   Status Achieved           PT Long Term Goals - 11/28/15 1037    PT LONG TERM GOAL #1   Title understand proper posture and body mechanics   Status Achieved               Plan - 12/01/15 1047    Clinical Impression Statement Pt report that she has had the most relief with  traction in regards to pain. Due to pt positive response from last tx, tx not changed.   Pt will benefit from skilled therapeutic intervention in order to improve on the following deficits Decreased activity tolerance;Decreased strength;Decreased range of motion;Difficulty walking;Impaired flexibility;Increased muscle spasms;Improper body mechanics;Postural dysfunction;Pain   Rehab Potential Good   PT Frequency 2x / week   PT Duration 8 weeks   PT Treatment/Interventions ADLs/Self Care Home Management;Electrical Stimulation;Moist Heat;Therapeutic exercise;Ultrasound;Traction;Neuromuscular re-education;Patient/family education;Manual techniques;Taping;Passive range of motion   PT Next Visit Plan continue traction        Problem List Patient Active Problem List   Diagnosis Date Noted  . Lumbago 11/07/2013  . Tear of rotator cuff 04/11/2013    Scot Jun, PTA  12/01/2015, 10:53 AM  [address] [place], [place], [address] Phone: [phone]   Fax:  [phone]  Name: Carrie Drake MRN: [id number] Date of Birth: 09-May-1946

## 2015-12-04 ENCOUNTER — Encounter: Payer: Self-pay | Admitting: Physical Therapy

## 2015-12-04 ENCOUNTER — Ambulatory Visit: Payer: Medicare Other | Attending: Physical Medicine and Rehabilitation | Admitting: Physical Therapy

## 2015-12-04 DIAGNOSIS — R262 Difficulty in walking, not elsewhere classified: Secondary | ICD-10-CM | POA: Insufficient documentation

## 2015-12-04 DIAGNOSIS — M5442 Lumbago with sciatica, left side: Secondary | ICD-10-CM | POA: Diagnosis present

## 2015-12-04 NOTE — Therapy (Signed)
[place] [address] [place], [place], [address] Phone: [phone]   Fax:  [phone]  Physical Therapy Treatment  Patient Details  Name: Carrie Drake MRN: [id number] Date of Birth: 01-12-1946 Referring Provider: penny jones  Encounter Date: 12/04/2015      PT End of Session - 12/04/15 1035    Visit Number 8   Date for PT Re-Evaluation 01/09/16   PT Start Time 1010   PT Stop Time 1055   PT Time Calculation (min) 45 min      Past Medical History  Diagnosis Date  . Hypothyroidism   . 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 GERD (gastroesophageal reflux disease)   . Right rotator cuff tear   . Tobacco use disorder   . Other and unspecified hyperlipidemia   . Migraine, unspecified, without mention of intractable migraine without mention of status migrainosus   . Unspecified disorder of skin and subcutaneous tissue   . Backache, unspecified   . Lumbago 11/07/2013  . Complication of anesthesia     nausea & vomiting from Morphine    Past Surgical History  Procedure Laterality Date  . Cholecystectomy  yrs ago  . Left foot bunion removed  yrs ago  . Cervical neck surgery for c 2 fx  1997    plate inserted, limited neck motion to left  . Back surgery  yrs ago    lower back surgery x 2  . Shoulder open rotator cuff repair Right 04/11/2013    Procedure: RIGHT ROTATOR CUFF REPAIR SHOULDER OPEN, ACROMIONECTOMY w/ GRAFT;  Surgeon: Tobi Bastos, MD;  [facility];  Service: Orthopedics;  Laterality: Right;  . Laminectomy    . Cosmetic surgery      FACE LIFT 2012  . Tonsillectomy    . Esophagogastroduodenoscopy (egd) with propofol N/A 06/10/2014    Procedure: ESOPHAGOGASTRODUODENOSCOPY (EGD) WITH PROPOFOL;  Surgeon: Garlan Fair, MD;  [facility];  Service: Endoscopy;  Laterality: N/A;  . Colonoscopy with propofol N/A 06/10/2014    Procedure: COLONOSCOPY WITH PROPOFOL;   Surgeon: Garlan Fair, MD;  [facility];  Service: Endoscopy;  Laterality: N/A;  . Esophageal manometry N/A 06/24/2014    Procedure: ESOPHAGEAL MANOMETRY (EM);  Surgeon: Garlan Fair, MD;  [facility];  Service: Endoscopy;  Laterality: N/A;    There were no vitals filed for this visit.  Visit Diagnosis:  Difficulty walking  Bilateral low back pain with left-sided sciatica      Subjective Assessment - 12/04/15 1011    Subjective about 24 hours relief after traction but then pain right back if I do anything. No significant changes with PT   Currently in Pain? Yes   Pain Score 7    Pain Location Back            [facility] PT Assessment - 12/04/15 0001    AROM   Overall AROM Comments lumbar ROM 50% limited and very guarded d/t pain                     [facility] Adult PT Treatment/Exercise - 12/04/15 0001    Lumbar Exercises: Aerobic   Tread Mill Nu Step Level 5 x 5 minutes   Lumbar Exercises: Machines for Strengthening   Other Lumbar Machine Exercise seated row and lats 15# 2x10 each   Other Lumbar Machine Exercise sit fit pelvic ROM   Lumbar  Exercises: Seated   Long Arc Quad on Chair Both;15 reps  on sit fit    Sit to Stand Limitations sit fit red scap stab 10 times each   Moist Heat Therapy   Number Minutes Moist Heat 15 Minutes   Moist Heat Location Lumbar Spine   Traction   Type of Traction Lumbar   Max (lbs) 35   Hold Time static   Time 15                  PT Short Term Goals - 11/20/15 1148    PT SHORT TERM GOAL #1   Title independent with initial HEP   Status Achieved           PT Long Term Goals - 11/28/15 1037    PT LONG TERM GOAL #1   Title understand proper posture and body mechanics   Status Achieved               Plan - 12/04/15 1035    Clinical Impression Statement pt tolerated ther ex , some increased pain and VCing needed for posture. No goals met this week d/t increased pain. traction  does give relief but only about 24 hours.   PT Next Visit Plan continue traction, assess benefits of PT interventions        Problem List Patient Active Problem List   Diagnosis Date Noted  . Lumbago 11/07/2013  . Tear of rotator cuff 04/11/2013    Mackensie Pilson,ANGIE PTA 12/04/2015, 10:38 AM  [place] [address] [place], [place], [address] Phone: [phone]   Fax:  [phone]  Name: Carrie Drake MRN: [id number] Date of Birth: 1946-02-19

## 2015-12-09 ENCOUNTER — Encounter: Payer: Self-pay | Admitting: Physical Therapy

## 2015-12-09 ENCOUNTER — Ambulatory Visit: Payer: Medicare Other | Admitting: Physical Therapy

## 2015-12-09 DIAGNOSIS — M5442 Lumbago with sciatica, left side: Secondary | ICD-10-CM

## 2015-12-09 DIAGNOSIS — R262 Difficulty in walking, not elsewhere classified: Secondary | ICD-10-CM

## 2015-12-09 NOTE — Therapy (Signed)
[place] [address] [place], [place], [address] Phone: [phone]   Fax:  [phone]  Physical Therapy Treatment  Patient Details  Name: Carrie Drake MRN: [id number] Date of Birth: 11-Oct-1946 Referring Provider: penny jones  Encounter Date: 12/09/2015      PT End of Session - 12/09/15 1521    Visit Number 9   Date for PT Re-Evaluation 01/09/16   PT Start Time 1440   PT Stop Time 1535   PT Time Calculation (min) 55 min   Activity Tolerance Patient limited by pain;Patient tolerated treatment well   Behavior During Therapy [facility] for tasks assessed/performed      Past Medical History  Diagnosis Date  . Hypothyroidism   . 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 GERD (gastroesophageal reflux disease)   . Right rotator cuff tear   . Tobacco use disorder   . Other and unspecified hyperlipidemia   . Migraine, unspecified, without mention of intractable migraine without mention of status migrainosus   . Unspecified disorder of skin and subcutaneous tissue   . Backache, unspecified   . Lumbago 11/07/2013  . Complication of anesthesia     nausea & vomiting from Morphine    Past Surgical History  Procedure Laterality Date  . Cholecystectomy  yrs ago  . Left foot bunion removed  yrs ago  . Cervical neck surgery for c 2 fx  1997    plate inserted, limited neck motion to left  . Back surgery  yrs ago    lower back surgery x 2  . Shoulder open rotator cuff repair Right 04/11/2013    Procedure: RIGHT ROTATOR CUFF REPAIR SHOULDER OPEN, ACROMIONECTOMY w/ GRAFT;  Surgeon: Tobi Bastos, MD;  [facility];  Service: Orthopedics;  Laterality: Right;  . Laminectomy    . Cosmetic surgery      FACE LIFT 2012  . Tonsillectomy    . Esophagogastroduodenoscopy (egd) with propofol N/A 06/10/2014    Procedure: ESOPHAGOGASTRODUODENOSCOPY (EGD) WITH PROPOFOL;  Surgeon: Garlan Fair, MD;  [facility];  Service: Endoscopy;  Laterality: N/A;  . Colonoscopy with propofol N/A 06/10/2014    Procedure: COLONOSCOPY WITH PROPOFOL;  Surgeon: Garlan Fair, MD;  [facility];  Service: Endoscopy;  Laterality: N/A;  . Esophageal manometry N/A 06/24/2014    Procedure: ESOPHAGEAL MANOMETRY (EM);  Surgeon: Garlan Fair, MD;  [facility];  Service: Endoscopy;  Laterality: N/A;    There were no vitals filed for this visit.  Visit Diagnosis:  Difficulty walking  Bilateral low back pain with left-sided sciatica      Subjective Assessment - 12/09/15 1441    Subjective She reports that she is still hurting, some relief with traction, but pain come back in 2 days.   Currently in Pain? Yes   Pain Score 6    Pain Location Back   Pain Orientation Lower   Aggravating Factors  ADL's   Pain Relieving Factors some relief wiht traction                         [facility] Adult PT Treatment/Exercise - 12/09/15 0001    Therapeutic Activites    Therapeutic Activities ADL's   ADL's Went over with patient the importance of correct body mechanics for ADL's, prolonged standing foot prop, golfer's lift, weight close to body, dance with vaccuum etc.. with PT demo  and patient verbalized understanding   Lumbar Exercises: Stretches   Passive Hamstring Stretch 3 reps;20 seconds   Piriformis Stretch 20 seconds;3 reps   Lumbar Exercises: Aerobic   Tread Mill Nu Step Level 5 x 5 minutes   Lumbar Exercises: Machines for Strengthening   Cybex Knee Extension 5# 2x10   Cybex Knee Flexion 20# 2x10   Other Lumbar Machine Exercise straight arm pull down 20# 2x10, hip extension no weight facing wall to decrease fwd trunk lean, red tband pot stir for obliques, resisted gait fwd and side stepping   Traction   Type of Traction Lumbar   Max (lbs) 40 for 10 minutes and then 45# for 5 minutes   Hold Time static   Time 15                  PT Short Term Goals - 11/20/15  1148    PT SHORT TERM GOAL #1   Title independent with initial HEP   Status Achieved           PT Long Term Goals - 12/09/15 1525    PT LONG TERM GOAL #1   Title understand proper posture and body mechanics   Status Achieved   PT LONG TERM GOAL #2   Title increase lumbar ROM 25%   Status On-going   PT LONG TERM GOAL #3   Title decrease pain 50%   Status On-going   PT LONG TERM GOAL #4   Title tolerate grocery shopping without difficulkty   Status Partially Met               Plan - 12/09/15 1523    Clinical Impression Statement Had pain with some exercises, she wants to push through though, she reported that she would not be able to change her body mechanics due to that is how she has been doing them for years.  I tried to increase traction weight today without adverse effects after.   PT Next Visit Plan 10th visit g-code, assess   Consulted and Agree with Plan of Care Patient        Problem List Patient Active Problem List   Diagnosis Date Noted  . Lumbago 11/07/2013  . Tear of rotator cuff 04/11/2013    Sumner Boast., PT 12/09/2015, 3:26 PM  [place] [address] [place], [place], [address] Phone: [phone]   Fax:  [phone]  Name: Carrie Drake MRN: [id number] Date of Birth: Apr 10, 1946

## 2015-12-11 ENCOUNTER — Ambulatory Visit: Payer: Medicare Other | Admitting: Physical Therapy

## 2015-12-16 ENCOUNTER — Ambulatory Visit: Payer: Medicare Other | Admitting: Physical Therapy

## 2015-12-16 ENCOUNTER — Encounter: Payer: Self-pay | Admitting: Physical Therapy

## 2015-12-16 DIAGNOSIS — R262 Difficulty in walking, not elsewhere classified: Secondary | ICD-10-CM

## 2015-12-16 DIAGNOSIS — M5442 Lumbago with sciatica, left side: Secondary | ICD-10-CM

## 2015-12-16 NOTE — Therapy (Signed)
[address] [place], [place], [address] Phone: [phone]   Fax:  [phone]  Physical Therapy Treatment  Patient Details  Name: Carrie Drake MRN: [id number] Date of Birth: 1946-10-07 Referring Provider: penny jones  Encounter Date: 12/16/2015      PT End of Session - 12/16/15 1531    Visit Number 10   Date for PT Re-Evaluation 01/09/16   PT Start Time 6720   PT Stop Time 9470   PT Time Calculation (min) 52 min   Activity Tolerance Patient limited by pain;Patient tolerated treatment well   Behavior During Therapy [facility] for tasks assessed/performed      Past Medical History  Diagnosis Date  . Hypothyroidism   . 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 GERD (gastroesophageal reflux disease)   . Right rotator cuff tear   . Tobacco use disorder   . Other and unspecified hyperlipidemia   . Migraine, unspecified, without mention of intractable migraine without mention of status migrainosus   . Unspecified disorder of skin and subcutaneous tissue   . Backache, unspecified   . Lumbago 11/07/2013  . Complication of anesthesia     nausea & vomiting from Morphine    Past Surgical History  Procedure Laterality Date  . Cholecystectomy  yrs ago  . Left foot bunion removed  yrs ago  . Cervical neck surgery for c 2 fx  1997    plate inserted, limited neck motion to left  . Back surgery  yrs ago    lower back surgery x 2  . Shoulder open rotator cuff repair Right 04/11/2013    Procedure: RIGHT ROTATOR CUFF REPAIR SHOULDER OPEN, ACROMIONECTOMY w/ GRAFT;  Surgeon: Tobi Bastos, MD;  Location: [facility];  Service: Orthopedics;  Laterality: Right;  . Laminectomy    . Cosmetic surgery      FACE LIFT 2012  . Tonsillectomy    . Esophagogastroduodenoscopy (egd) with propofol N/A 06/10/2014    Procedure: ESOPHAGOGASTRODUODENOSCOPY (EGD) WITH PROPOFOL;  Surgeon: Garlan Fair, MD;  [facility];  Service: Endoscopy;  Laterality: N/A;  . Colonoscopy with propofol N/A 06/10/2014    Procedure: COLONOSCOPY WITH PROPOFOL;  Surgeon: Garlan Fair, MD;  [facility];  Service: Endoscopy;  Laterality: N/A;  . Esophageal manometry N/A 06/24/2014    Procedure: ESOPHAGEAL MANOMETRY (EM);  Surgeon: Garlan Fair, MD;  [facility];  Service: Endoscopy;  Laterality: N/A;    There were no vitals filed for this visit.  Visit Diagnosis:  Difficulty walking  Bilateral low back pain with left-sided sciatica      Subjective Assessment - 12/16/15 1524    Subjective I was very sore after last time, I was sore in the neck, I feel like I was gaurded.   Currently in Pain? Yes   Pain Score 5    Pain Location Back   Pain Orientation Lower   Pain Descriptors / Indicators Aching   Pain Type Chronic pain                         [facility] Adult PT Treatment/Exercise - 12/16/15 0001    Lumbar Exercises: Stretches   Passive Hamstring Stretch 3 reps;20 seconds   Piriformis Stretch 20 seconds;3 reps   Lumbar Exercises: Aerobic   Tread Mill Nu Step Level 5 x 5 minutes   Lumbar Exercises: Machines for Strengthening  Other Lumbar Machine Exercise straight arm pull down 20# 2x10, hip extension no weight facing wall to decrease fwd trunk lean, red tband pot stir for obliques, resisted gait fwd and side stepping   Lumbar Exercises: Sidelying   Other Sidelying Lumbar Exercises seated on sit fit, pelvic mobility and stability, weighted ball trunk rotation and trunk overhead raise; LAQ   Traction   Type of Traction Lumbar   Max (lbs) 40 for 10 minutes and then 45# for 5 minutes   Hold Time static   Time 15   Manual Therapy   Manual Therapy Soft tissue mobilization;Joint mobilization;Manual Traction   Soft tissue mobilization to the right SI and lumbar area and into the buttock                  PT Short Term Goals - 11/20/15 1148    PT SHORT TERM  GOAL #1   Title independent with initial HEP   Status Achieved           PT Long Term Goals - 12/09/15 1525    PT LONG TERM GOAL #1   Title understand proper posture and body mechanics   Status Achieved   PT LONG TERM GOAL #2   Title increase lumbar ROM 25%   Status On-going   PT LONG TERM GOAL #3   Title decrease pain 50%   Status On-going   PT LONG TERM GOAL #4   Title tolerate grocery shopping without difficulkty   Status Partially Met               Plan - 01/01/2016 1532    Clinical Impression Statement Patient with a "Type A" personality has difficulty relaxing.  Has significant lumbar spasms.  Remains very tight in the LE's   PT Next Visit Plan continue with current plan to get her stronger and more flexible   Consulted and Agree with Plan of Care Patient          G-Codes - 2016-01-01 1605    Functional Assessment Tool Used foto 59% limitation   Functional Limitation Other PT primary   Other PT Primary Current Status (E3953) At least 40 percent but less than 60 percent impaired, limited or restricted   Other PT Primary Goal Status (U0233) At least 40 percent but less than 60 percent impaired, limited or restricted      Problem List Patient Active Problem List   Diagnosis Date Noted  . Lumbago 11/07/2013  . Tear of rotator cuff 04/11/2013    Sumner Boast., PT 2016-01-01, 4:08 PM  [address] [place], [place], [address] Phone: [phone]   Fax:  [phone]  Name: Carrie Drake MRN: [id number] Date of Birth: Mar 09, 1946

## 2015-12-19 ENCOUNTER — Encounter: Payer: Self-pay | Admitting: Physical Therapy

## 2015-12-19 ENCOUNTER — Ambulatory Visit: Payer: Medicare Other | Admitting: Physical Therapy

## 2015-12-19 DIAGNOSIS — R262 Difficulty in walking, not elsewhere classified: Secondary | ICD-10-CM

## 2015-12-19 DIAGNOSIS — M5442 Lumbago with sciatica, left side: Secondary | ICD-10-CM

## 2015-12-19 NOTE — Therapy (Signed)
[address] [place], [place], [address] Phone: [phone]   Fax:  [phone]  Physical Therapy Treatment  Patient Details  Name: Carrie Drake MRN: [id number] Date of Birth: [birth year] Referring Provider: penny jones  Encounter Date: 12/19/2015      PT End of Session - 12/19/15 1005    Visit Number 11   Date for PT Re-Evaluation 01/09/16   PT Start Time 0929   PT Stop Time 1020   PT Time Calculation (min) 51 min   Activity Tolerance Patient limited by pain;Patient tolerated treatment well   Behavior During Therapy [facility] for tasks assessed/performed      Past Medical History  Diagnosis Date  . Hypothyroidism   . 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 GERD (gastroesophageal reflux disease)   . Right rotator cuff tear   . Tobacco use disorder   . Other and unspecified hyperlipidemia   . Migraine, unspecified, without mention of intractable migraine without mention of status migrainosus   . Unspecified disorder of skin and subcutaneous tissue   . Backache, unspecified   . Lumbago 11/07/2013  . Complication of anesthesia     nausea & vomiting from Morphine    Past Surgical History  Procedure Laterality Date  . Cholecystectomy  yrs ago  . Left foot bunion removed  yrs ago  . Cervical neck surgery for c 2 fx  1997    plate inserted, limited neck motion to left  . Back surgery  yrs ago    lower back surgery x 2  . Shoulder open rotator cuff repair Right 04/11/2013    Procedure: RIGHT ROTATOR CUFF REPAIR SHOULDER OPEN, ACROMIONECTOMY w/ GRAFT;  Surgeon: Tobi Bastos, MD;  [facility];  Service: Orthopedics;  Laterality: Right;  . Laminectomy    . Cosmetic surgery      FACE LIFT 2012  . Tonsillectomy    . Esophagogastroduodenoscopy (egd) with propofol N/A 06/10/2014    Procedure: ESOPHAGOGASTRODUODENOSCOPY (EGD) WITH PROPOFOL;  Surgeon: Garlan Fair, MD;  [facility];  Service: Endoscopy;  Laterality: N/A;  . Colonoscopy with propofol N/A 06/10/2014    Procedure: COLONOSCOPY WITH PROPOFOL;  Surgeon: Garlan Fair, MD;  [facility];  Service: Endoscopy;  Laterality: N/A;  . Esophageal manometry N/A 06/24/2014    Procedure: ESOPHAGEAL MANOMETRY (EM);  Surgeon: Garlan Fair, MD;  [facility];  Service: Endoscopy;  Laterality: N/A;    There were no vitals filed for this visit.  Visit Diagnosis:  Difficulty walking  Bilateral low back pain with left-sided sciatica      Subjective Assessment - 12/19/15 1001    Subjective Not sure if this is helping, I feel more flexible and I have lost weight but I do not think the pain is changing   Currently in Pain? Yes   Pain Score 5    Pain Location Back   Pain Orientation Lower   Pain Descriptors / Indicators Aching   Pain Type Chronic pain                         [facility] Adult PT Treatment/Exercise - 12/19/15 0001    Therapeutic Activites    ADL's Went over with patient the importance of correct body mechanics for ADL's, prolonged standing foot prop, golfer's lift, weight close to body, dance with vaccuum etc.. with PT demo and  patient verbalized understanding   Lumbar Exercises: Stretches   Passive Hamstring Stretch 3 reps;20 seconds   Double Knee to Chest Stretch 5 reps;10 seconds   Lower Trunk Rotation 10 seconds;5 reps   ITB Stretch 5 reps;10 seconds   Piriformis Stretch 20 seconds;3 reps   Moist Heat Therapy   Number Minutes Moist Heat 15 Minutes   Moist Heat Location Lumbar Spine   Electrical Stimulation   Electrical Stimulation Location lumbar spine   Electrical Stimulation Action IFC   Electrical Stimulation Parameters supine   Electrical Stimulation Goals Pain                PT Education - 12/19/15 1004    Education provided Yes   Education Details went over exercises to do at home, gentle flexion and flexibility with supine  pelvic stability   Person(s) Educated Patient   Methods Explanation;Demonstration   Comprehension Verbalized understanding;Returned demonstration          PT Short Term Goals - 11/20/15 1148    PT SHORT TERM GOAL #1   Title independent with initial HEP   Status Achieved           PT Long Term Goals - 12/19/15 1006    PT LONG TERM GOAL #1   Title understand proper posture and body mechanics   Status Achieved   PT LONG TERM GOAL #2   Title increase lumbar ROM 25%   Status Achieved   PT LONG TERM GOAL #3   Title decrease pain 50%   Status Not Met   PT LONG TERM GOAL #4   Title tolerate grocery shopping without difficulkty   Status Partially Met               Plan - 12/19/15 1005    Clinical Impression Statement Patient with minimal changes in pain, she has better flexibility but really pain is unchanged, we talked about body mechanics and exercises she is to do at home, I also recommended that she return to the MD.   PT Next Visit Plan will hold 2 weeks and then D/C    Consulted and Agree with Plan of Care Patient        Problem List Patient Active Problem List   Diagnosis Date Noted  . Lumbago 11/07/2013  . Tear of rotator cuff 04/11/2013    Sumner Boast., PT 12/19/2015, 10:07 AM  [address] [place], [place], [address] Phone: [phone]   Fax:  [phone]  Name: Carrie Drake MRN: [id number] Date of Birth: 07-Jan-1946

## 2016-02-16 ENCOUNTER — Telehealth: Payer: Self-pay | Admitting: Cardiology

## 2016-02-16 NOTE — Telephone Encounter (Signed)
Received referral packet from [facility] for upcoming appointment on 03/04/2016 with Dr. Percival Spanish.  Records given to [facility].  cbr

## 2016-02-27 ENCOUNTER — Other Ambulatory Visit: Payer: Self-pay | Admitting: Cardiovascular Disease

## 2016-02-27 ENCOUNTER — Ambulatory Visit (INDEPENDENT_AMBULATORY_CARE_PROVIDER_SITE_OTHER): Payer: Medicare Other | Admitting: Cardiovascular Disease

## 2016-02-27 ENCOUNTER — Encounter: Payer: Self-pay | Admitting: Cardiovascular Disease

## 2016-02-27 VITALS — BP 150/84 | HR 81 | Ht 59.0 in | Wt 134.2 lb

## 2016-02-27 DIAGNOSIS — Z720 Tobacco use: Secondary | ICD-10-CM | POA: Diagnosis not present

## 2016-02-27 DIAGNOSIS — E785 Hyperlipidemia, unspecified: Secondary | ICD-10-CM | POA: Diagnosis not present

## 2016-02-27 DIAGNOSIS — I10 Essential (primary) hypertension: Secondary | ICD-10-CM | POA: Diagnosis not present

## 2016-02-27 HISTORY — DX: Tobacco use: Z72.0

## 2016-02-27 HISTORY — DX: Essential (primary) hypertension: I10

## 2016-02-27 HISTORY — DX: Hyperlipidemia, unspecified: E78.5

## 2016-02-27 LAB — COMPREHENSIVE METABOLIC PANEL
ALK PHOS: 56 U/L (ref 33–130)
ALT: 16 U/L (ref 6–29)
AST: 16 U/L (ref 10–35)
Albumin: 4.3 g/dL (ref 3.6–5.1)
BUN: 10 mg/dL (ref 7–25)
CALCIUM: 9.7 mg/dL (ref 8.6–10.4)
CHLORIDE: 102 mmol/L (ref 98–110)
CO2: 25 mmol/L (ref 20–31)
Creat: 0.92 mg/dL (ref 0.50–0.99)
GLUCOSE: 82 mg/dL (ref 65–99)
POTASSIUM: 4.8 mmol/L (ref 3.5–5.3)
Sodium: 139 mmol/L (ref 135–146)
Total Bilirubin: 0.5 mg/dL (ref 0.2–1.2)
Total Protein: 6.7 g/dL (ref 6.1–8.1)

## 2016-02-27 MED ORDER — BUPROPION HCL ER (SR) 150 MG PO TB12: ORAL_TABLET | ORAL | Status: DC

## 2016-02-27 MED ORDER — PRAVASTATIN SODIUM 20 MG PO TABS: 20.0000 mg | ORAL_TABLET | Freq: Every day | ORAL | Status: DC

## 2016-02-27 NOTE — Patient Instructions (Addendum)
Medication Instructions:  START PRAVASTATIN 20 MG DAILY  START WELLBUTRIN 150 MG ONCE DAILY FOR 3 DAYS AND THEN INCREASE TO TWICE A DAY FOR 12 WEEKS.  YOU NEED TO BE ON THIS FOR 1 WEEK BEFORE YOU STOP SMOKING   Labwork: LP/CMET DOWNSTAIRS FIRST FLOOR AT [facility]  Testing/Procedures: NONE  Follow-Up: Your physician recommends that you schedule a follow-up appointment in: [facility] [place] wants you to follow-up in: [place] [address] [place] will receive a reminder letter in the mail two months in advance. If you don't receive a letter, please call our office to schedule the follow-up appointment.  If you need a refill on your cardiac medications before your next appointment, please call your pharmacy.

## 2016-02-27 NOTE — Progress Notes (Signed)
Cardiology Office Note   Date:  02/27/2016   ID:  Carrie Drake 08/07/1946, MRN [id number]  PCP:  Berkley Harvey, NP  Cardiologist:   Skeet Latch, MD   Chief Complaint  Patient presents with  . New Evaluation    Referred by Eldridge Abrahams, NP for pure hypercholesterolemia  pt c/o chest tightness in center of chest towards the left side--depends on what she eats;  no other Sx.    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  She was starting on Mevachor at that time.  She was well-controlled on rosuvastatin but her insurance won't pay for it.  She was switched to atorvastatin and her peptic ulcer worsened.  She was later diagnosed with jackhammer esophagus.  She stopped her statin because she was concerned about the memory loss and diabetes.  Since stopping the atovastatin she no longer has a problem with the jackhammer esophagus.  In retrospect she realizes that she was having this problem taking rosuvastatin. She was referred to cardiology for further management of her hyperlipidemia.  Carrie Drake has several family members with premature coronary artery disease. Her father had many strokes, the earliest occurred in his 64s. Her mother had a myocardial fraction at 77. Her brother had a cardiac arrest and several heart attacks since his first in his 73s. He recently had a limitation of bilateral legs due to diabetes.  Her youngest son is taking medicine for hyperlipidemia in her oldest son does not get medical care, so she does not know if he has hyperlipidemia.  Carrie Drake reports episodes of chest discomfort that typically occur after eating but very occasionally occur at rest. It is associated with dizziness and last for a couple minutes at a time. There is  no shortness of breath, nausea, vomiting, or diaphoresis.  She does not get any regular exercise because she hurt her back over a year ago.  She continues to smoke.  She has tried patches, gum, hypnosis, acupuncture and Chantix without success.  She had terrible dreams.   Past Medical History  Diagnosis Date  . Hypothyroidism   . 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 GERD (gastroesophageal reflux disease)   . Right rotator cuff tear   . Tobacco use disorder   . Other and unspecified hyperlipidemia   . Migraine, unspecified, without mention of intractable migraine without mention of status migrainosus   . Unspecified disorder of skin and subcutaneous tissue   . Backache, unspecified   . Lumbago 11/07/2013  . Complication of anesthesia     nausea & vomiting from Morphine  . Essential hypertension 02/27/2016  . Hyperlipidemia 02/27/2016  . Tobacco abuse 02/27/2016    Past Surgical History  Procedure Laterality Date  . Cholecystectomy  yrs ago  . Left foot bunion removed  yrs ago  . Cervical neck surgery for c 2 fx  1997    plate inserted, limited neck motion to left  . Back surgery  yrs ago    lower back surgery x 2  . Shoulder open rotator cuff repair Right 04/11/2013    Procedure: RIGHT ROTATOR CUFF REPAIR SHOULDER OPEN, ACROMIONECTOMY w/ GRAFT;  Surgeon: Tobi Bastos, MD;  Location: [facility];  Service: Orthopedics;  Laterality: Right;  . Laminectomy    . Cosmetic surgery      FACE LIFT 2012  . Tonsillectomy    . Esophagogastroduodenoscopy (egd) with propofol N/A 06/10/2014    Procedure: ESOPHAGOGASTRODUODENOSCOPY (EGD) WITH PROPOFOL;  Surgeon: Garlan Fair, MD;  [facility];  Service: Endoscopy;  Laterality: N/A;  . Colonoscopy with propofol N/A 06/10/2014    Procedure: COLONOSCOPY WITH PROPOFOL;  Surgeon: Garlan Fair, MD;  [facility];  Service: Endoscopy;  Laterality: N/A;  . Esophageal manometry N/A 06/24/2014    Procedure:  ESOPHAGEAL MANOMETRY (EM);  Surgeon: Garlan Fair, MD;  [facility];  Service: Endoscopy;  Laterality: N/A;     Current Outpatient Prescriptions  Medication Sig Dispense Refill  . aspirin EC 81 MG tablet Take 81 mg by mouth every morning.     . Calcium-Magnesium-Vitamin D (CALCIUM 1200+D3 PO) Take by mouth daily.    Marland Kitchen diltiazem (CARDIZEM SR) 90 MG 12 hr capsule Take 90 mg by mouth 2 (two) times daily.    . diphenhydrAMINE (SOMINEX) 25 MG tablet Take 25 mg by mouth at bedtime as needed for sleep.    Javier Docker Oil 1000 MG CAPS Take 1 capsule by mouth daily.    . lansoprazole (PREVACID) 30 MG capsule Take 30 mg by mouth every evening.     Marland Kitchen levothyroxine (SYNTHROID, LEVOTHROID) 50 MCG tablet Take 50 mcg by mouth daily before breakfast.    . tretinoin (RETIN-A) 0.05 % cream Apply 1 application topically daily.    Marland Kitchen buPROPion (WELLBUTRIN SR) 150 MG 12 hr tablet 1 TABLET BY MOUTH TWICE A DAY OR AS DIRECTED BY YOUR PHYSICIAN 60 tablet 3  . pravastatin (PRAVACHOL) 20 MG tablet Take 1 tablet (20 mg total) by mouth daily. 90 tablet 1   No current facility-administered medications for this visit.    Allergies:   Morphine and related; Sulfa antibiotics; Tetracycline hcl; Atorvastatin; Isosorbide; Penicillins; and Tetracyclines & related    Social History:  The patient  reports that she has been smoking Cigarettes.  She has a 15 pack-year smoking history. She has never used smokeless tobacco. She reports that she drinks alcohol. She reports that she does not use illicit drugs.   Family History:  The patient's family history includes Alzheimer's disease in her mother; Diabetes in her brother and father; Heart attack in her brother and mother; Stroke in her father.    ROS:  Please see the history of present illness.   Otherwise, review of systems are positive for none.   All other systems are reviewed and negative.    PHYSICAL EXAM: VS:  BP 150/84 mmHg  Pulse 81  Ht 4\' 11"  (1.499 m)   Wt 60.873 kg (134 lb 3.2 oz)  BMI 27.09 kg/m2 , BMI Body mass index is 27.09 kg/(m^2). GENERAL:  Well appearing HEENT:  Pupils equal round and reactive, fundi not visualized, oral mucosa unremarkable NECK:  No jugular venous distention, waveform within normal limits, carotid upstroke brisk and symmetric, no bruits, no thyromegaly LYMPHATICS:  No cervical adenopathy LUNGS:  Clear to auscultation bilaterally HEART:  RRR.  PMI not displaced or sustained,S1 and S2 within normal limits, no S3, no S4, no clicks, no rubs, II/VI systolic  murmurs ABD:  Flat, positive bowel sounds normal in frequency in pitch, no bruits, no rebound, no guarding, no midline pulsatile mass, no hepatomegaly, no splenomegaly EXT:  2 plus pulses throughout, no edema, no cyanosis no clubbing.  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 The ekg ordered today demonstrates sinus rhythm, sinus arrhythmia.  Rte 81 bpm.  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  She has an LDL >200 and several family members with premature CAD.  She also has a small tendon xanthoma.  She has been unable to tolerate atorvastatin or rosuvastatin due to jackhammer  esophagus.   She is not on any statin and started krill oil two weeks ago.  We will check a fasting lipid panel and CMP today.  We will also start pravastatin 20 mg daily.  She will follow up with Erasmo Downer in 6 weeks.  If she is unable to tolerate pravastatitn she will likely be a candidate for a PCSK9 inhibitor.  # Hypertension: Carrie Drake blood pressure is poorly-controlled.  She has not yet taken her diltiazem.  She is on this because she also has a history of PVCs.  We will not make any chagnes at this time, as she reports her BP is well-controlled when she takes her medicine.  # Tobacco abuse: She has tried several options.  She is willing to try Welbutrin 150 mg daily followed by 150mg  bid for 12 weeks.   Current medicines are reviewed at length with the patient today.  The patient does not have concerns regarding medicines.  The following changes have been made:  Stop krill oil.  Start pravastatin and Wellbutrin.  Labs/ tests ordered today include:   Orders Placed This Encounter  Procedures  . Comprehensive Metabolic Panel (CMET)  . Lipid panel  . EKG 12-Lead    Disposition:   FU with Tomasa Dobransky C. Oval Linsey, MD, [facility] in 6 months.   This note was written with the assistance of speech recognition software.  Please excuse any transcriptional errors.  Signed, Lu Paradise C. Oval Linsey, MD, [facility]  02/27/2016 10:47 PM    [place]

## 2016-03-02 ENCOUNTER — Telehealth: Payer: Self-pay | Admitting: *Deleted

## 2016-03-02 DIAGNOSIS — E785 Hyperlipidemia, unspecified: Secondary | ICD-10-CM

## 2016-03-02 LAB — LIPID PANEL
CHOLESTEROL: 337 mg/dL — AB (ref 125–200)
HDL: 55 mg/dL (ref >=46)
LDL Cholesterol: 243 mg/dL — ABNORMAL HIGH (ref <130)
Total CHOL/HDL Ratio: 6.1 Ratio — ABNORMAL HIGH (ref <=5.0)
Triglycerides: 195 mg/dL — ABNORMAL HIGH (ref <150)
VLDL: 39 mg/dL — AB (ref <30)

## 2016-03-02 NOTE — Telephone Encounter (Signed)
Advised patient of lab results  

## 2016-03-02 NOTE — Telephone Encounter (Signed)
-----   Message from Skeet Latch, MD sent at 03/01/2016  4:44 PM EDT ----- Normal kidney function, liver function and electrolytes.

## 2016-03-04 ENCOUNTER — Ambulatory Visit: Payer: Medicare Other | Admitting: Cardiovascular Disease

## 2016-03-09 ENCOUNTER — Encounter: Payer: Self-pay | Admitting: Cardiovascular Disease

## 2016-03-10 ENCOUNTER — Encounter: Payer: Self-pay | Admitting: Cardiovascular Disease

## 2016-03-10 ENCOUNTER — Telehealth: Payer: Self-pay | Admitting: *Deleted

## 2016-03-10 NOTE — Telephone Encounter (Signed)
OK 

## 2016-03-10 NOTE — Telephone Encounter (Signed)
Received my chart message patient that she is having the same esophageal issues she had with the Lipitor taking the Pravastatin Her PCP advised her to stop taking and contact Dr Oval Linsey She took today but will stop tomorrow and continue [place] patient to keep follow up with Pharm D and will forward to Dr Oval Linsey for review

## 2016-03-31 ENCOUNTER — Encounter: Payer: Medicare Other | Admitting: Pharmacist Clinician (PhC)/ Clinical Pharmacy Specialist

## 2016-03-31 LAB — LIPID PANEL
Cholesterol: 323 mg/dL — ABNORMAL HIGH (ref 125–200)
HDL: 56 mg/dL (ref >=46)
LDL Cholesterol: 243 mg/dL — ABNORMAL HIGH (ref <130)
TRIGLYCERIDES: 122 mg/dL (ref <150)
Total CHOL/HDL Ratio: 5.8 Ratio — ABNORMAL HIGH (ref <=5.0)
VLDL: 24 mg/dL (ref <30)

## 2016-04-07 ENCOUNTER — Ambulatory Visit (INDEPENDENT_AMBULATORY_CARE_PROVIDER_SITE_OTHER): Payer: Medicare Other | Admitting: Pharmacist Clinician (PhC)/ Clinical Pharmacy Specialist

## 2016-04-07 VITALS — Ht 59.0 in | Wt 133.0 lb

## 2016-04-07 DIAGNOSIS — E785 Hyperlipidemia, unspecified: Secondary | ICD-10-CM | POA: Diagnosis not present

## 2016-04-07 NOTE — Assessment & Plan Note (Signed)
Patient with familial hyperlipidemia, now on ezetimibe.  Will start paperwork to get authorization for Repatha for heterozygous familial hyperlipidemia.  Patient aware of product options and would prefer every other week injections, as she has some tape sensitivities.  Have discussed cost options and she will fill out patient assistance forms today. Will see her back for first dose injection training once approved.

## 2016-04-07 NOTE — Progress Notes (Signed)
04/07/2016 Carrie Drake June 20, 1946 [id number]   HPI:  Carrie Drake is a 70 y.o. female patient of Dr Oval Linsey, who presents today for a lipid clinic evaluation.  She is a retired Therapist, sports with familial hyperlipidemia.  She has a strong family history of CAD, a small tendon xanthoma and an LDL of 243.  This gives her a [place] Criteria score of 10.  She herself has not had any coronary events.  She does report episodes of chest discomfort associated with dizziness.  At her last visit with Dr. Oval Linsey she was given a prescription for wellbutrin to help with smoking cessation, but has not started at this time.  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 Range 02/27/2016 09:51 02/27/2016 16:14 03/31/2016 09:35  Cholesterol Latest Ref Range: 125-200 mg/dL 337 (H)  323 (H)  Triglycerides Latest Ref Range: <150 mg/dL 195 (H)  122  HDL Cholesterol Latest Ref Range: >=46 mg/dL 55  56  LDL (calc) Latest Ref Range: <130 mg/dL 243 (H)  243 (H)  VLDL Latest Ref Range: <30 mg/dL 39 (H)  24    Current Outpatient Prescriptions  Medication Sig Dispense Refill  . aspirin EC 81 MG tablet Take 81 mg by  mouth every morning.     Marland Kitchen buPROPion (WELLBUTRIN SR) 150 MG 12 hr tablet 1 TABLET BY MOUTH TWICE A DAY OR AS DIRECTED BY YOUR PHYSICIAN 60 tablet 3  . Calcium-Magnesium-Vitamin D (CALCIUM 1200+D3 PO) Take by mouth daily.    Marland Kitchen diltiazem (CARDIZEM SR) 90 MG 12 hr capsule Take 90 mg by mouth 2 (two) times daily.    . diphenhydrAMINE (SOMINEX) 25 MG tablet Take 25 mg by mouth at bedtime as needed for sleep.    Javier Docker Oil 1000 MG CAPS Take 1 capsule by mouth daily.    . lansoprazole (PREVACID) 30 MG capsule Take 30 mg by mouth every evening.     Marland Kitchen levothyroxine (SYNTHROID, LEVOTHROID) 50 MCG tablet Take 50 mcg by mouth daily before breakfast.    . tretinoin (RETIN-A) 0.05 % cream Apply 1 application topically daily.     No current facility-administered medications for this visit.    Allergies  Allergen Reactions  . Morphine And Related Nausea And Vomiting  . Sulfa Antibiotics Anaphylaxis  . Tetracycline Hcl Hives    Bull-hives  . Atorvastatin Other (See Comments)    Jackhammer esophagus  . Isosorbide Other (See Comments)    dizziness  . Penicillins Hives  . Pravachol [Pravastatin Sodium]     Difficulty like with the Lipitor   . Tetracyclines & Related Hives    Bull-hives    Past Medical  History  Diagnosis Date  . Hypothyroidism   . 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 GERD (gastroesophageal reflux disease)   . Right rotator cuff tear   . Tobacco use disorder   . Other and unspecified hyperlipidemia   . Migraine, unspecified, without mention of intractable migraine without mention of status migrainosus   . Unspecified disorder of skin and subcutaneous tissue   . Backache, unspecified   . Lumbago 11/07/2013  . Complication of anesthesia     nausea & vomiting from Morphine  . Essential hypertension 02/27/2016  . Hyperlipidemia 02/27/2016  . Tobacco abuse 02/27/2016    Height 4\' 11"  (1.499 m), weight 133 lb (60.328 kg).    Tommy Medal PharmD CPP [facility]

## 2016-04-07 NOTE — Patient Instructions (Signed)
Start ezetimibe 10 mg tablets once daily.     We will start the process for Repatha 140 mg injections to be taken every 2 weeks  Cholesterol Cholesterol is a fat. Your body needs a small amount of cholesterol. Cholesterol may build up in your blood vessels. This increases your chance of having a heart attack or stroke. You cannot feel your cholesterol levels. The only way to know your cholesterol level is high is with a blood test. Keep your test results. Work with your doctor to keep your cholesterol at a good level. WHAT DO THE TEST RESULTS MEAN?  Total cholesterol is how much cholesterol is in your blood.  LDL is bad cholesterol. This is the type that can build up. You want LDL to be low.  HDL is good cholesterol. It cleans your blood vessels and carries LDL away. You want HDL to be high.  Triglycerides are fat that the body can burn for energy or store. WHAT ARE GOOD LEVELS OF CHOLESTEROL?  Total cholesterol below 200.  LDL below 100 for people at risk. Below 70 for those at very high risk.  HDL above 50 is good. Above 60 is best.  Triglycerides below 150. HOW CAN I LOWER MY CHOLESTEROL?  Diet. Follow your diet programs as told by your doctor.  Choose fish, white meat chicken, roasted [place], or baked [place]. Try not to eat red meat, fried foods, or processed meats such as sausage and lunch meats.  Eat lots of fresh fruits and vegetables.  Choose whole grains, beans, pasta, potatoes, and cereals.  Use only small amounts of olive, corn, or canola oils.  Try not to eat butter, mayonnaise, shortening, or palm kernel oils.  Try not to eat foods with trans fats.  Drink skim or nonfat milk. Eat low-fat or nonfat yogurt and cheeses. Try not to drink whole milk or cream. Try not to eat ice cream, egg yolks, and full-fat cheeses.  Healthy desserts include angel food cake, ginger snaps, animal crackers, hard candy, popsicles, and low-fat or nonfat frozen yogurt. Try not to eat  pastries, cakes, pies, and cookies.  Exercise. Follow your exercise programs as told by your doctor.  Be more active. You can try gardening, walking, or taking the stairs. Ask your doctor about how you can be more active.  Medicine. Take medicine as told by your doctor.   This information is not intended to replace advice given to you by your health care provider. Make sure you discuss any questions you have with your health care provider.   Document Released: 01/14/2009 Document Revised: 11/08/2014 Document Reviewed: 08/01/2013 Elsevier Interactive Patient Education [employer].

## 2016-04-27 ENCOUNTER — Other Ambulatory Visit: Payer: Self-pay | Admitting: Pharmacist Clinician (PhC)/ Clinical Pharmacy Specialist

## 2016-04-27 MED ORDER — EVOLOCUMAB 140 MG/ML ~~LOC~~ SOAJ: 140.0000 mg | SUBCUTANEOUS | Status: DC

## 2016-04-27 NOTE — Telephone Encounter (Signed)
PA approved for Repatha 140 mg pens, RX sent to [facility].

## 2016-08-02 ENCOUNTER — Encounter: Payer: Self-pay | Admitting: Pharmacist

## 2016-08-02 ENCOUNTER — Telehealth: Payer: Self-pay | Admitting: Pharmacist

## 2016-08-02 NOTE — Telephone Encounter (Signed)
Pt called to schedule appt for lipid clinic.  She has not heard from PA for [place] yet except to send social security information.  She states the copay is costprohibitive for her and would be interested in participating in trial.   Will have her get labs the morning of next appt since she lives out of town as previous labs were only with one month of Zetia therapy.   Pt states understanding and appreciation.

## 2016-08-18 ENCOUNTER — Other Ambulatory Visit: Payer: Self-pay | Admitting: Nurse Practitioner

## 2016-08-18 DIAGNOSIS — Z122 Encounter for screening for malignant neoplasm of respiratory organs: Secondary | ICD-10-CM

## 2016-08-18 DIAGNOSIS — Z87891 Personal history of nicotine dependence: Secondary | ICD-10-CM

## 2016-08-20 ENCOUNTER — Other Ambulatory Visit: Payer: Self-pay | Admitting: Cardiovascular Disease

## 2016-08-20 ENCOUNTER — Ambulatory Visit (INDEPENDENT_AMBULATORY_CARE_PROVIDER_SITE_OTHER): Payer: Medicare Other | Admitting: Pharmacist Clinician (PhC)/ Clinical Pharmacy Specialist

## 2016-08-20 DIAGNOSIS — E7801 Familial hypercholesterolemia: Secondary | ICD-10-CM | POA: Diagnosis not present

## 2016-08-20 DIAGNOSIS — E785 Hyperlipidemia, unspecified: Secondary | ICD-10-CM

## 2016-08-20 LAB — LIPID PANEL
CHOLESTEROL: 262 mg/dL — AB (ref 125–200)
HDL: 54 mg/dL (ref >=46)
LDL Cholesterol: 177 mg/dL — ABNORMAL HIGH (ref <130)
TRIGLYCERIDES: 157 mg/dL — AB (ref <150)
Total CHOL/HDL Ratio: 4.9 Ratio (ref <=5.0)
VLDL: 31 mg/dL — ABNORMAL HIGH (ref <30)

## 2016-08-20 LAB — HEPATIC FUNCTION PANEL
ALBUMIN: 4.4 g/dL (ref 3.6–5.1)
ALK PHOS: 64 U/L (ref 33–130)
ALT: 21 U/L (ref 6–29)
AST: 19 U/L (ref 10–35)
BILIRUBIN TOTAL: 0.5 mg/dL (ref 0.2–1.2)
Bilirubin, Direct: 0.1 mg/dL (ref <=0.2)
Indirect Bilirubin: 0.4 mg/dL (ref 0.2–1.2)
Total Protein: 7 g/dL (ref 6.1–8.1)

## 2016-08-20 NOTE — Patient Instructions (Signed)
We will contact the [employer] regarding research studies.   Cholesterol Cholesterol is a fat. Your body needs a small amount of cholesterol. Cholesterol may build up in your blood vessels. This increases your chance of having a heart attack or stroke. You cannot feel your cholesterol levels. The only way to know your cholesterol level is high is with a blood test. Keep your test results. Work with your doctor to keep your cholesterol at a good level. WHAT DO THE TEST RESULTS MEAN?  Total cholesterol is how much cholesterol is in your blood.  LDL is bad cholesterol. This is the type that can build up. You want LDL to be low.  HDL is good cholesterol. It cleans your blood vessels and carries LDL away. You want HDL to be high.  Triglycerides are fat that the body can burn for energy or store. WHAT ARE GOOD LEVELS OF CHOLESTEROL?  Total cholesterol below 200.  LDL below 100 for people at risk. Below 70 for those at very high risk.  HDL above 50 is good. Above 60 is best.  Triglycerides below 150. HOW CAN I LOWER MY CHOLESTEROL?  Diet. Follow your diet programs as told by your doctor.  Choose fish, white meat chicken, roasted [place], or baked [place]. Try not to eat red meat, fried foods, or processed meats such as sausage and lunch meats.  Eat lots of fresh fruits and vegetables.  Choose whole grains, beans, pasta, potatoes, and cereals.  Use only small amounts of olive, corn, or canola oils.  Try not to eat butter, mayonnaise, shortening, or palm kernel oils.  Try not to eat foods with trans fats.  Drink skim or nonfat milk. Eat low-fat or nonfat yogurt and cheeses. Try not to drink whole milk or cream. Try not to eat ice cream, egg yolks, and full-fat cheeses.  Healthy desserts include angel food cake, ginger snaps, animal crackers, hard candy, popsicles, and low-fat or nonfat frozen yogurt. Try not to eat pastries, cakes, pies, and cookies.  Exercise.  Follow your exercise programs as told by your doctor.  Be more active. You can try gardening, walking, or taking the stairs. Ask your doctor about how you can be more active.  Medicine. Take medicine as told by your doctor.   This information is not intended to replace advice given to you by your health care provider. Make sure you discuss any questions you have with your health care provider.   Document Released: 01/14/2009 Document Revised: 11/08/2014 Document Reviewed: 08/01/2013 Elsevier Interactive Patient Education [employer].

## 2016-08-20 NOTE — Progress Notes (Signed)
08/20/2016 Carrie Drake December 02, 1945 [id number]   HPI:  Carrie Drake is a 70 y.o. female patient of Dr Oval Linsey, who presents today for a lipid clinic follow up.  At her last visit we applied for Repatha thru her insurance company, as she has familial hyperlipidemia, with a [place] Score of 10.  Her insurance covered the drug, but with a copay of $800/month.  This was beyond her ability to pay and she applied for assistance thru the [employer].  Unfortunately she was denied by them and thus has not had any medication.  She is currently on Zetia 10 mg daily, which she tolerates well, and her last lab showed a drop in LDL from 243 to 169, a 30% drop.  While this is goodr this does not get her to goal, it is an improvement.    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 Range 02/27/2016 09:51 02/27/2016 16:14 03/31/2016 09:35  Cholesterol Latest Ref Range: 125-200 mg/dL 337 (H)  323 (H)  Triglycerides Latest Ref Range: <150 mg/dL 195 (H)  122  HDL Cholesterol Latest Ref Range: >=46 mg/dL 55  56  LDL (calc) Latest Ref Range: <130 mg/dL 243 (H)  243 (H)  VLDL Latest Ref Range: <30  mg/dL 39 (H)  24    Current Outpatient Prescriptions  Medication Sig Dispense Refill  . aspirin EC 81 MG tablet Take 81 mg by mouth every morning.     Marland Kitchen buPROPion (WELLBUTRIN SR) 150 MG 12 hr tablet 1 TABLET BY MOUTH TWICE A DAY OR AS DIRECTED BY YOUR PHYSICIAN 60 tablet 3  . Calcium-Magnesium-Vitamin D (CALCIUM 1200+D3 PO) Take by mouth daily.    Marland Kitchen diltiazem (CARDIZEM SR) 90 MG 12 hr capsule Take 90 mg by mouth 2 (two) times daily.    . diphenhydrAMINE (SOMINEX) 25 MG tablet Take 25 mg by mouth at bedtime as needed for sleep.    . Evolocumab (REPATHA SURECLICK) [id number] MG/ML SOAJ Inject 140 mg into the skin every 14 (fourteen) days. 2 pen 12  . Krill Oil 1000 MG CAPS Take 1 capsule by mouth daily.    . lansoprazole (PREVACID) 30 MG capsule Take 30 mg by mouth every evening.     Marland Kitchen levothyroxine (SYNTHROID, LEVOTHROID) 50 MCG tablet Take 50 mcg by mouth daily before breakfast.    . tretinoin (RETIN-A) 0.05 % cream Apply 1 application topically daily.     No current facility-administered medications for this visit.     Allergies  Allergen Reactions  . Morphine And Related Nausea And Vomiting  . Sulfa Antibiotics Anaphylaxis  . Tetracycline Hcl Hives    Bull-hives  .  Atorvastatin Other (See Comments)    Jackhammer esophagus  . Isosorbide Other (See Comments)    dizziness  . Penicillins Hives  . Pravachol [Pravastatin Sodium]     Difficulty like with the Lipitor   . Tetracyclines & Related Hives    Bull-hives    Past Medical History:  Diagnosis Date  . Backache, unspecified   . Cancer ([place]) 5 yrs ago   squamous cell, right leg  . Complication of anesthesia    nausea & vomiting from Morphine  . Essential hypertension 02/27/2016  . GERD (gastroesophageal reflux disease)   . Hyperlipidemia 02/27/2016  . Hypothyroidism   . Lumbago 11/07/2013  . Migraine, unspecified, without mention of intractable migraine without mention of status migrainosus   . Other and unspecified hyperlipidemia    . Right rotator cuff tear   . Tobacco abuse 02/27/2016  . Tobacco use disorder   . Unspecified disorder of skin and subcutaneous tissue     There were no vitals taken for this visit.  Assessment/Plan: Patient will go to lab for updated lipid values today.  We are submitting her for consideration of either of the lipid trials thru [facility], although patient would prefer to get into the CLEAR trial (so she doesn't have to self-inject).  Information forwarded to Berneda Rose at [facility].   Tommy Medal PharmD CPP [facility]

## 2016-08-20 NOTE — Assessment & Plan Note (Addendum)
Patient will go to lab for updated lipid values today.  We are submitting her for consideration of either of the lipid trials thru [facility], although patient would prefer to get into the CLEAR trial (so she doesn't have to self-inject).  Information forwarded to Berneda Rose at [facility].

## 2016-09-10 ENCOUNTER — Ambulatory Visit
Admission: RE | Admit: 2016-09-10 | Discharge: 2016-09-10 | Disposition: A | Discharge status: Home / Self Care | Payer: Medicare Other | Source: Ambulatory Visit | Attending: Nurse Practitioner | Admitting: Nurse Practitioner

## 2016-09-10 DIAGNOSIS — Z87891 Personal history of nicotine dependence: Secondary | ICD-10-CM

## 2016-09-10 DIAGNOSIS — Z122 Encounter for screening for malignant neoplasm of respiratory organs: Secondary | ICD-10-CM

## 2016-10-04 ENCOUNTER — Encounter: Payer: Self-pay | Admitting: *Deleted

## 2016-10-04 DIAGNOSIS — Z006 Encounter for examination for normal comparison and control in clinical research program: Secondary | ICD-10-CM

## 2016-10-04 NOTE — Progress Notes (Signed)
Late entry: Subject met inclusion and exclusion criteria. The informed consent form, study requirements and expectations were reviewed with the subject and questions and concerns were addressed prior to the signing of the consent form. The subject verbalized understanding of the trail requirements. The subject agreed to participate in the CLEARtrial and signed the informed consent. The informed consent was obtained prior to performance of any protocol-specific procedures for the subject. A copy of the signed informed consent was given to the subject and a copy was placed in the subject's medical record.        Jake Bathe, RN September 29, 2016 0930

## 2016-10-07 ENCOUNTER — Encounter: Payer: Self-pay | Admitting: *Deleted

## 2016-10-07 ENCOUNTER — Other Ambulatory Visit: Payer: Self-pay | Admitting: *Deleted

## 2016-10-07 MED ORDER — AMBULATORY NON FORMULARY MEDICATION: 180.0000 mg | Freq: Every day | Status: DC

## 2016-10-31 NOTE — Progress Notes (Deleted)
Note was in error

## 2017-03-25 ENCOUNTER — Other Ambulatory Visit: Payer: Self-pay | Admitting: Nurse Practitioner

## 2017-03-25 DIAGNOSIS — F17210 Nicotine dependence, cigarettes, uncomplicated: Secondary | ICD-10-CM

## 2017-04-07 ENCOUNTER — Ambulatory Visit
Admission: RE | Admit: 2017-04-07 | Discharge: 2017-04-07 | Disposition: A | Discharge status: Home / Self Care | Payer: Medicare Other | Source: Ambulatory Visit | Attending: Nurse Practitioner | Admitting: Nurse Practitioner

## 2017-04-07 DIAGNOSIS — F17210 Nicotine dependence, cigarettes, uncomplicated: Secondary | ICD-10-CM

## 2017-06-06 ENCOUNTER — Telehealth: Payer: Self-pay | Admitting: *Deleted

## 2017-06-06 NOTE — Telephone Encounter (Signed)
Returned phone call to subject who called into office due to "explosive diarrhea".  She has contacted her PCP to inform her of this also. She said that the event has been ongoing for about 2 weeks.  She is going to take a "drug holiday" until June 13, 2017.  She is also going to come back to the Research clinic on June 09, 2017 for an unscheduled hematology re-draw.

## 2017-06-15 ENCOUNTER — Telehealth: Payer: Self-pay | Admitting: *Deleted

## 2017-06-15 NOTE — Telephone Encounter (Signed)
Spoke with patient to give her the results of a Hematology re-draw.  She had had a cortisone injection 1 day prior to lab draws for the Clear research study.  Most all components of lab had returned to normal limits.

## 2017-11-03 ENCOUNTER — Encounter: Payer: Self-pay | Admitting: *Deleted

## 2017-11-03 DIAGNOSIS — Z006 Encounter for examination for normal comparison and control in clinical research program: Secondary | ICD-10-CM

## 2017-11-03 NOTE — Progress Notes (Signed)
Subject to research clinic for visit T6-M12 in the Clear research study.  Stated had RFA to back in November 2018, no additional saes to report.  Feeling better after the holidays are over due to her back pain.  92% compliant with her medication new meds dispensed.  F/u phone call and next clinic visit scheduled.

## 2018-01-18 NOTE — Progress Notes (Deleted)
This encounter was created in error - please disregard.  This encounter was created in error - please disregard.

## 2018-05-08 VITALS — BP 136/55 | HR 69 | Wt 128.0 lb

## 2018-05-08 DIAGNOSIS — Z006 Encounter for examination for normal comparison and control in clinical research program: Secondary | ICD-10-CM

## 2018-05-08 NOTE — Progress Notes (Signed)
Amai Cappiello Wah to research clinic for visit [phone] in the Clear study.  No complaints, adverse events, or serious adverse events to report.  Subject 95% compliant with medications and new medication dispensed.  Next appointment scheduled.

## 2018-08-21 ENCOUNTER — Ambulatory Visit: Payer: Medicare Other | Attending: Orthopedic Surgery | Admitting: Physical Therapy

## 2018-08-21 ENCOUNTER — Encounter: Payer: Self-pay | Admitting: Physical Therapy

## 2018-08-21 DIAGNOSIS — M25511 Pain in right shoulder: Secondary | ICD-10-CM | POA: Diagnosis not present

## 2018-08-21 DIAGNOSIS — M25611 Stiffness of right shoulder, not elsewhere classified: Secondary | ICD-10-CM

## 2018-08-21 DIAGNOSIS — R600 Localized edema: Secondary | ICD-10-CM | POA: Diagnosis present

## 2018-08-21 NOTE — Therapy (Signed)
[address] [place], [place], [address] Phone: [phone]   Fax:  [phone]  Physical Therapy Evaluation  Patient Details  Name: Carrie Drake MRN: [id number] Date of Birth: 08/19/1946 Referring Provider (PT): Esmond Plants   Encounter Date: 08/21/2018  PT End of Session - 08/21/18 1428    Visit Number  1    Date for PT Re-Evaluation  10/21/18    PT Start Time  1350    PT Stop Time  1446    PT Time Calculation (min)  56 min    Activity Tolerance  Patient limited by pain;Patient tolerated treatment well    Behavior During Therapy  [facility] for tasks assessed/performed       Past Medical History:  Diagnosis Date  . Backache, unspecified   . 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 Complication of anesthesia    nausea & vomiting from Morphine  . Essential hypertension 02/27/2016  . GERD (gastroesophageal reflux disease)   . Hyperlipidemia 02/27/2016  . Hypothyroidism   . Lumbago 11/07/2013  . Migraine, unspecified, without mention of intractable migraine without mention of status migrainosus   . Other and unspecified hyperlipidemia   . Right rotator cuff tear   . Tobacco abuse 02/27/2016  . Tobacco use disorder   . Unspecified disorder of skin and subcutaneous tissue     Past Surgical History:  Procedure Laterality Date  . BACK SURGERY  yrs ago   lower back surgery x 2  . cervical neck surgery for C 2 fx  1997   plate inserted, limited neck motion to left  . CHOLECYSTECTOMY  yrs ago  . COLONOSCOPY WITH PROPOFOL N/A 06/10/2014   Procedure: COLONOSCOPY WITH PROPOFOL;  Surgeon: Garlan Fair, MD;  Location: [facility];  Service: Endoscopy;  Laterality: N/A;  . COSMETIC SURGERY     FACE LIFT 2012  . ESOPHAGEAL MANOMETRY N/A 06/24/2014   Procedure: ESOPHAGEAL MANOMETRY (EM);  Surgeon: Garlan Fair, MD;  [facility]: [facility];  Service: Endoscopy;  Laterality: N/A;  .  ESOPHAGOGASTRODUODENOSCOPY (EGD) WITH PROPOFOL N/A 06/10/2014   Procedure: ESOPHAGOGASTRODUODENOSCOPY (EGD) WITH PROPOFOL;  Surgeon: Garlan Fair, MD;  [facility];  Service: Endoscopy;  Laterality: N/A;  . LAMINECTOMY    . left foot bunion removed  yrs ago  . SHOULDER OPEN ROTATOR CUFF REPAIR Right 04/11/2013   Procedure: RIGHT ROTATOR CUFF REPAIR SHOULDER OPEN, ACROMIONECTOMY w/ GRAFT;  Surgeon: Tobi Bastos, MD;  Location: [facility];  Service: Orthopedics;  Laterality: Right;  . TONSILLECTOMY      There were no vitals filed for this visit.   Subjective Assessment - 08/21/18 1358    Subjective  Patient reports that she fell and hit her right shoulder on cabinet, she was then painting and felt like she started having pain with decreased ROM.  She has a hx of right shoulder RC repair about 4-5 years ago.  Had cortisone injection 2 weeks ago    Limitations  Lifting;House hold activities    Patient Stated Goals  have less pain and better ROM    Currently in Pain?  Yes    Pain Score  2     Pain Location  Shoulder    Pain Orientation  Right    Pain Descriptors / Indicators  Aching;Sharp    Pain Type  Acute pain    Pain Radiating Towards  denies    Pain Onset  More than a month ago    Aggravating Factors   reaching out, up, doing hair and getting dressed pain 8/10    Pain Relieving Factors  at rest, cymbalta pain can be 1/10    Effect of Pain on Daily Activities  difficulty with ADL's         [facility] PT Assessment - 08/21/18 0001      Assessment   Medical Diagnosis  right shoulder inpingement    Referring Provider (PT)  Esmond Plants    Onset Date/Surgical Date  08/07/18    Hand Dominance  Right    Prior Therapy  years ago after surgery      Precautions   Precautions  None      Balance Screen   Has the patient fallen in the past 6 months  Yes    How many times?  1    Has the patient had a decrease in activity level because of a fear of falling?   No    Is the  patient reluctant to leave their home because of a fear of falling?   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    Leisure  walks 3x/week      Posture/Postural Control   Posture Comments  fwd head, rounded shoulders      ROM / Strength   AROM / PROM / Strength  AROM;PROM;Strength      AROM   AROM Assessment Site  Shoulder    Right/Left Shoulder  Right    Right Shoulder Flexion  80 Degrees    Right Shoulder ABduction  46 Degrees    Right Shoulder Internal Rotation  25 Degrees    Right Shoulder External Rotation  70 Degrees      PROM   PROM Assessment Site  Shoulder    Right/Left Shoulder  Right    Right Shoulder Flexion  130 Degrees    Right Shoulder ABduction  70 Degrees    Right Shoulder Internal Rotation  30 Degrees    Right Shoulder External Rotation  80 Degrees      Strength   Overall Strength Comments  4-/5 for ER/IR in neutral, could not test flexion and abduction due to pain and lack of motions      Palpation   Palpation comment  she is very tender in the right biceps and in the posterior right shoulder along the supra and infraspinatus, right pectoral mms      Special Tests   Other special tests  + impingement, very painful                Objective measurements completed on examination: See above findings.      [facility] Adult PT Treatment/Exercise - 08/21/18 0001      Modalities   Modalities  Electrical Stimulation;Vasopneumatic      Acupuncturist Location  right shoulder    Electrical Stimulation Action  IFC    Electrical Stimulation Parameters  sitting    Electrical Stimulation Goals  Pain      Vasopneumatic   Number Minutes Vasopneumatic   15 minutes    Vasopnuematic Location   Shoulder    Vasopneumatic Pressure  Medium    Vasopneumatic Temperature   35  PT Education - 08/21/18 1427    Education provided  Yes    Education Details  AAROM/PROM of the right shoulder    Person(s) Educated  Patient    Methods  Explanation;Demonstration;Handout;Tactile cues;Verbal cues    Comprehension  Verbalized understanding;Returned demonstration       PT Short Term Goals - 08/21/18 1431      PT SHORT TERM GOAL #1   Title  independent with initial HEP    Time  2    Period  Weeks    Status  New        PT Long Term Goals - 08/21/18 1432      PT LONG TERM GOAL #1   Title  understand proper posture and body mechanics    Time  8    Period  Weeks    Status  New      PT LONG TERM GOAL #2   Title  increase right shoulder AROM to 120 degrees flexion    Time  8    Period  Weeks    Status  New      PT LONG TERM GOAL #3   Title  decrease pain 50%    Time  8    Period  Weeks    Status  New      PT LONG TERM GOAL #4   Title  report no difficulty dressing or doing her hair    Time  8    Period  Weeks    Status  New             Plan - 08/21/18 1429    Clinical Impression Statement  Patient has a history of right shoulder RC repair 4-5 years ago, she reports that about 6 weeks ago she fell and hit her shoulder on the cabinet, she reports that she felt okay but was then painting and really started having pain and decreased ROM.  X-rays negative, MD feels it is impingment, she had a cortisone injection 2 weeks ago, she reports minimal changes in pain.  She is very limited in ROM, she is very painful with her motions, she is very tender in the posterior and anterior shoulder    Clinical Presentation  Stable    Clinical Decision Making  Low    Rehab Potential  Good    PT Frequency  2x / week    PT Duration  8 weeks    PT Treatment/Interventions  ADLs/Self Care Home Management;Electrical Stimulation;Moist Heat;Therapeutic exercise;Ultrasound;Neuromuscular re-education;Patient/family education;Manual techniques;Taping;Passive  range of motion;Iontophoresis 4mg /ml Dexamethasone;Vasopneumatic Device    PT Next Visit Plan  slowly start exercises    Consulted and Agree with Plan of Care  Patient       Patient will benefit from skilled therapeutic intervention in order to improve the following deficits and impairments:  Decreased range of motion, Impaired UE functional use, Increased muscle spasms, Pain, Impaired flexibility, Decreased strength, Postural dysfunction, Increased edema  Visit Diagnosis: Acute pain of right shoulder - Plan: PT plan of care cert/re-cert  Stiffness of right shoulder, not elsewhere classified - Plan: PT plan of care cert/re-cert  Localized edema - Plan: PT plan of care cert/re-cert     Problem List Patient Active Problem List   Diagnosis Date Noted  . Essential hypertension 02/27/2016  . Hyperlipidemia 02/27/2016  . Tobacco abuse 02/27/2016  . Lumbago 11/07/2013  . Tear of rotator cuff 04/11/2013    Sumner Boast., PT 08/21/2018, 2:34 PM  [facility] [place], [place], [address] Phone: [phone]   Fax:  [phone]  Name: Carrie Drake MRN: [id number] Date of Birth: 03-23-1946

## 2018-09-04 ENCOUNTER — Encounter: Payer: Self-pay | Admitting: Physical Therapy

## 2018-09-04 ENCOUNTER — Ambulatory Visit: Payer: Medicare Other | Attending: Orthopedic Surgery | Admitting: Physical Therapy

## 2018-09-04 DIAGNOSIS — M25611 Stiffness of right shoulder, not elsewhere classified: Secondary | ICD-10-CM

## 2018-09-04 DIAGNOSIS — M25511 Pain in right shoulder: Secondary | ICD-10-CM | POA: Insufficient documentation

## 2018-09-04 DIAGNOSIS — R600 Localized edema: Secondary | ICD-10-CM | POA: Diagnosis present

## 2018-09-04 NOTE — Therapy (Signed)
[address] [place], [place], [address] Phone: [phone]   Fax:  [phone]  Physical Therapy Treatment  Patient Details  Name: Carrie Drake MRN: [id number] Date of Birth: 1946-04-14 Referring Provider (PT): Esmond Plants   Encounter Date: 09/04/2018  PT End of Session - 09/04/18 1632    Visit Number  2    PT Start Time  6962    PT Stop Time  1645    PT Time Calculation (min)  48 min    Activity Tolerance  Patient tolerated treatment well    Behavior During Therapy  [facility] for tasks assessed/performed       Past Medical History:  Diagnosis Date  . Backache, unspecified   . 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 Complication of anesthesia    nausea & vomiting from Morphine  . Essential hypertension 02/27/2016  . GERD (gastroesophageal reflux disease)   . Hyperlipidemia 02/27/2016  . Hypothyroidism   . Lumbago 11/07/2013  . Migraine, unspecified, without mention of intractable migraine without mention of status migrainosus   . Other and unspecified hyperlipidemia   . Right rotator cuff tear   . Tobacco abuse 02/27/2016  . Tobacco use disorder   . Unspecified disorder of skin and subcutaneous tissue     Past Surgical History:  Procedure Laterality Date  . BACK SURGERY  yrs ago   lower back surgery x 2  . cervical neck surgery for C 2 fx  1997   plate inserted, limited neck motion to left  . CHOLECYSTECTOMY  yrs ago  . COLONOSCOPY WITH PROPOFOL N/A 06/10/2014   Procedure: COLONOSCOPY WITH PROPOFOL;  Surgeon: Garlan Fair, MD;  [facility];  Service: Endoscopy;  Laterality: N/A;  . COSMETIC SURGERY     FACE LIFT 2012  . ESOPHAGEAL MANOMETRY N/A 06/24/2014   Procedure: ESOPHAGEAL MANOMETRY (EM);  Surgeon: Garlan Fair, MD;  [facility];  Service: Endoscopy;  Laterality: N/A;  . ESOPHAGOGASTRODUODENOSCOPY (EGD) WITH PROPOFOL N/A 06/10/2014   Procedure:  ESOPHAGOGASTRODUODENOSCOPY (EGD) WITH PROPOFOL;  Surgeon: Garlan Fair, MD;  [facility];  Service: Endoscopy;  Laterality: N/A;  . LAMINECTOMY    . left foot bunion removed  yrs ago  . SHOULDER OPEN ROTATOR CUFF REPAIR Right 04/11/2013   Procedure: RIGHT ROTATOR CUFF REPAIR SHOULDER OPEN, ACROMIONECTOMY w/ GRAFT;  Surgeon: Tobi Bastos, MD;  [facility];  Service: Orthopedics;  Laterality: Right;  . TONSILLECTOMY      There were no vitals filed for this visit.  Subjective Assessment - 09/04/18 1557    Subjective  Patient reports that she is stiff and doing the HEP, reports sore with this    Currently in Pain?  Yes    Pain Score  3     Pain Location  Shoulder    Pain Orientation  Right    Pain Descriptors / Indicators  Aching                       [facility] Adult PT Treatment/Exercise - 09/04/18 0001      Exercises   Exercises  Shoulder      Shoulder Exercises: Standing   External Rotation  Both;20 reps;Theraband    Theraband Level (Shoulder External Rotation)  Level 2 (Red)    Internal Rotation  Right;20 reps;Theraband    Theraband Level (Shoulder Internal Rotation)  Level 2 (Red)  Extension  Both;20 reps;Theraband    Theraband Level (Shoulder Extension)  Level 2 (Red)    Row  Both;20 reps;Theraband    Theraband Level (Shoulder Row)  Level 2 (Red)    Other Standing Exercises  wall wash, wall circles, wand exercises with PT over pressure    Other Standing Exercises  back to wall reaching up with ball, no weight, W backs with PT overpressure      Shoulder Exercises: ROM/Strengthening   UBE (Upper Arm Bike)  level 2 x 5 minutes      Electrical Stimulation   Electrical Stimulation Location  right shoulder    Electrical Stimulation Action  IFC    Electrical Stimulation Parameters  sitting    Electrical Stimulation Goals  Pain      Manual Therapy   Manual Therapy  Passive ROM    Passive ROM  to the right shoulder all motions                PT Short Term Goals - 09/04/18 1634      PT SHORT TERM GOAL #1   Title  independent with initial HEP    Status  Achieved        PT Long Term Goals - 08/21/18 1432      PT LONG TERM GOAL #1   Title  understand proper posture and body mechanics    Time  8    Period  Weeks    Status  New      PT LONG TERM GOAL #2   Title  increase right shoulder AROM to 120 degrees flexion    Time  8    Period  Weeks    Status  New      PT LONG TERM GOAL #3   Title  decrease pain 50%    Time  8    Period  Weeks    Status  New      PT LONG TERM GOAL #4   Title  report no difficulty dressing or doing her hair    Time  8    Period  Weeks    Status  New            Plan - 09/04/18 1633    Clinical Impression Statement  Patient did well with a return to exercises, she does need cues to decrease compensation, as well as not to puch too hard.  She had some back pain with trying the W backs    PT Next Visit Plan  slowly start exercises    Consulted and Agree with Plan of Care  Patient       Patient will benefit from skilled therapeutic intervention in order to improve the following deficits and impairments:  Decreased range of motion, Impaired UE functional use, Increased muscle spasms, Pain, Impaired flexibility, Decreased strength, Postural dysfunction, Increased edema  Visit Diagnosis: Acute pain of right shoulder  Stiffness of right shoulder, not elsewhere classified  Localized edema     Problem List Patient Active Problem List   Diagnosis Date Noted  . Essential hypertension 02/27/2016  . Hyperlipidemia 02/27/2016  . Tobacco abuse 02/27/2016  . Lumbago 11/07/2013  . Tear of rotator cuff 04/11/2013    Sumner Boast., PT 09/04/2018, 4:35 PM  [address] [place], [place], [address] Phone: [phone]   Fax:  [phone]  Name: Carrie Drake MRN:  [id number] Date of Birth: 01-17-1946

## 2018-09-11 ENCOUNTER — Encounter: Payer: Self-pay | Admitting: Physical Therapy

## 2018-09-11 ENCOUNTER — Ambulatory Visit: Payer: Medicare Other | Admitting: Physical Therapy

## 2018-09-11 DIAGNOSIS — M25511 Pain in right shoulder: Secondary | ICD-10-CM

## 2018-09-11 DIAGNOSIS — R600 Localized edema: Secondary | ICD-10-CM

## 2018-09-11 DIAGNOSIS — M25611 Stiffness of right shoulder, not elsewhere classified: Secondary | ICD-10-CM

## 2018-09-11 NOTE — Therapy (Signed)
[address] [place], [place], [address] Phone: [phone]   Fax:  [phone]  Physical Therapy Treatment  Patient Details  Name: Carrie Drake MRN: [id number] Date of Birth: [birth year] Referring Provider (PT): Esmond Plants   Encounter Date: 09/11/2018  PT End of Session - 09/11/18 1303    Visit Number  3    Date for PT Re-Evaluation  10/21/18    PT Start Time  1055    PT Stop Time  1150    PT Time Calculation (min)  55 min    Activity Tolerance  Patient tolerated treatment well    Behavior During Therapy  [facility] for tasks assessed/performed       Past Medical History:  Diagnosis Date  . Backache, unspecified   . 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 Complication of anesthesia    nausea & vomiting from Morphine  . Essential hypertension 02/27/2016  . GERD (gastroesophageal reflux disease)   . Hyperlipidemia 02/27/2016  . Hypothyroidism   . Lumbago 11/07/2013  . Migraine, unspecified, without mention of intractable migraine without mention of status migrainosus   . Other and unspecified hyperlipidemia   . Right rotator cuff tear   . Tobacco abuse 02/27/2016  . Tobacco use disorder   . Unspecified disorder of skin and subcutaneous tissue     Past Surgical History:  Procedure Laterality Date  . BACK SURGERY  yrs ago   lower back surgery x 2  . cervical neck surgery for C 2 fx  1997   plate inserted, limited neck motion to left  . CHOLECYSTECTOMY  yrs ago  . COLONOSCOPY WITH PROPOFOL N/A 06/10/2014   Procedure: COLONOSCOPY WITH PROPOFOL;  Surgeon: Garlan Fair, MD;  [facility];  Service: Endoscopy;  Laterality: N/A;  . COSMETIC SURGERY     FACE LIFT 2012  . ESOPHAGEAL MANOMETRY N/A 06/24/2014   Procedure: ESOPHAGEAL MANOMETRY (EM);  Surgeon: Garlan Fair, MD;  [facility];  Service: Endoscopy;  Laterality: N/A;  . ESOPHAGOGASTRODUODENOSCOPY (EGD)  WITH PROPOFOL N/A 06/10/2014   Procedure: ESOPHAGOGASTRODUODENOSCOPY (EGD) WITH PROPOFOL;  Surgeon: Garlan Fair, MD;  [facility];  Service: Endoscopy;  Laterality: N/A;  . LAMINECTOMY    . left foot bunion removed  yrs ago  . SHOULDER OPEN ROTATOR CUFF REPAIR Right 04/11/2013   Procedure: RIGHT ROTATOR CUFF REPAIR SHOULDER OPEN, ACROMIONECTOMY w/ GRAFT;  Surgeon: Tobi Bastos, MD;  [facility];  Service: Orthopedics;  Laterality: Right;  . TONSILLECTOMY      There were no vitals filed for this visit.  Subjective Assessment - 09/11/18 1259    Subjective  Patient reports that the right lateral anterior upper arm is really hurting today    Currently in Pain?  Yes    Pain Score  5     Pain Location  Shoulder    Pain Orientation  Right    Aggravating Factors   Unsure why she is hurting more                       [facility] Adult PT Treatment/Exercise - 09/11/18 0001      Shoulder Exercises: Standing   Extension  Both;20 reps;Theraband    Theraband Level (Shoulder Extension)  Level 2 (Red)    Row  Both;20 reps;Theraband    Theraband Level (Shoulder Row)  Level 2 (Red)  Shoulder Exercises: ROM/Strengthening   UBE (Upper Arm Bike)  level 2 x 5 minutes      Electrical Stimulation   Electrical Stimulation Location  right shoulder    Electrical Stimulation Action  IFC    Electrical Stimulation Parameters  sitting    Electrical Stimulation Goals  Pain      Vasopneumatic   Number Minutes Vasopneumatic   15 minutes    Vasopnuematic Location   Shoulder    Vasopneumatic Pressure  Medium    Vasopneumatic Temperature   35      Manual Therapy   Manual Therapy  Passive ROM;Soft tissue mobilization    Soft tissue mobilization  to the right upper arm anterior/lateral    Passive ROM  to the right shoulder all motions               PT Short Term Goals - 09/04/18 1634      PT SHORT TERM GOAL #1   Title  independent with initial HEP    Status   Achieved        PT Long Term Goals - 08/21/18 1432      PT LONG TERM GOAL #1   Title  understand proper posture and body mechanics    Time  8    Period  Weeks    Status  New      PT LONG TERM GOAL #2   Title  increase right shoulder AROM to 120 degrees flexion    Time  8    Period  Weeks    Status  New      PT LONG TERM GOAL #3   Title  decrease pain 50%    Time  8    Period  Weeks    Status  New      PT LONG TERM GOAL #4   Title  report no difficulty dressing or doing her hair    Time  8    Period  Weeks    Status  New            Plan - 09/11/18 1303    Clinical Impression Statement  Patient having increased right upper arm pain, she is very very tender, all motions caused increased pain today.  Tried to do more STM and stretching today    PT Next Visit Plan  see how she does with what we tried today    Consulted and Agree with Plan of Care  Patient       Patient will benefit from skilled therapeutic intervention in order to improve the following deficits and impairments:  Decreased range of motion, Impaired UE functional use, Increased muscle spasms, Pain, Impaired flexibility, Decreased strength, Postural dysfunction, Increased edema  Visit Diagnosis: Acute pain of right shoulder  Stiffness of right shoulder, not elsewhere classified  Localized edema     Problem List Patient Active Problem List   Diagnosis Date Noted  . Essential hypertension 02/27/2016  . Hyperlipidemia 02/27/2016  . Tobacco abuse 02/27/2016  . Lumbago 11/07/2013  . Tear of rotator cuff 04/11/2013    Sumner Boast., PT 09/11/2018, 1:06 PM  [address] [place], [place], [address] Phone: [phone]   Fax:  [phone]  Name: Carrie Drake MRN: [id number] Date of Birth: [birth year]

## 2018-09-20 ENCOUNTER — Ambulatory Visit: Payer: Medicare Other | Admitting: Physical Therapy

## 2018-09-20 ENCOUNTER — Encounter: Payer: Self-pay | Admitting: Physical Therapy

## 2018-09-20 DIAGNOSIS — M25611 Stiffness of right shoulder, not elsewhere classified: Secondary | ICD-10-CM

## 2018-09-20 DIAGNOSIS — M25511 Pain in right shoulder: Secondary | ICD-10-CM

## 2018-09-20 DIAGNOSIS — R600 Localized edema: Secondary | ICD-10-CM

## 2018-09-20 NOTE — Therapy (Signed)
[facility] [address] [place], [place], [address] Phone: [phone]   Fax:  [phone]  Physical Therapy Treatment  Patient Details  Name: Carrie Drake MRN: [id number] Date of Birth: May 30, 1946 Referring Provider (PT): Esmond Plants   Encounter Date: 09/20/2018  PT End of Session - 09/20/18 1133    Visit Number  4    Date for PT Re-Evaluation  10/21/18    PT Start Time  1050    PT Stop Time  1150    PT Time Calculation (min)  60 min    Activity Tolerance  Patient tolerated treatment well    Behavior During Therapy  [facility] for tasks assessed/performed       Past Medical History:  Diagnosis Date  . Backache, unspecified   . 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 Complication of anesthesia    nausea & vomiting from Morphine  . Essential hypertension 02/27/2016  . GERD (gastroesophageal reflux disease)   . Hyperlipidemia 02/27/2016  . Hypothyroidism   . Lumbago 11/07/2013  . Migraine, unspecified, without mention of intractable migraine without mention of status migrainosus   . Other and unspecified hyperlipidemia   . Right rotator cuff tear   . Tobacco abuse 02/27/2016  . Tobacco use disorder   . Unspecified disorder of skin and subcutaneous tissue     Past Surgical History:  Procedure Laterality Date  . BACK SURGERY  yrs ago   lower back surgery x 2  . cervical neck surgery for C 2 fx  1997   plate inserted, limited neck motion to left  . CHOLECYSTECTOMY  yrs ago  . COLONOSCOPY WITH PROPOFOL N/A 06/10/2014   Procedure: COLONOSCOPY WITH PROPOFOL;  Surgeon: Garlan Fair, MD;  Location: [facility];  Service: Endoscopy;  Laterality: N/A;  . COSMETIC SURGERY     FACE LIFT 2012  . ESOPHAGEAL MANOMETRY N/A 06/24/2014   Procedure: ESOPHAGEAL MANOMETRY (EM);  Surgeon: Garlan Fair, MD;  Location: [facility];  Service: Endoscopy;  Laterality: N/A;  . ESOPHAGOGASTRODUODENOSCOPY (EGD)  WITH PROPOFOL N/A 06/10/2014   Procedure: ESOPHAGOGASTRODUODENOSCOPY (EGD) WITH PROPOFOL;  Surgeon: Garlan Fair, MD;  [facility];  Service: Endoscopy;  Laterality: N/A;  . LAMINECTOMY    . left foot bunion removed  yrs ago  . SHOULDER OPEN ROTATOR CUFF REPAIR Right 04/11/2013   Procedure: RIGHT ROTATOR CUFF REPAIR SHOULDER OPEN, ACROMIONECTOMY w/ GRAFT;  Surgeon: Tobi Bastos, MD;  [facility]: [facility];  Service: Orthopedics;  Laterality: Right;  . TONSILLECTOMY      There were no vitals filed for this visit.  Subjective Assessment - 09/20/18 1054    Subjective  Patient continues to report the pain in the right lateral upper arm.  "not much change"    Currently in Pain?  Yes    Pain Score  2     Pain Location  Shoulder    Pain Orientation  Right    Pain Descriptors / Indicators  Sore    Aggravating Factors   activity         [facility] PT Assessment - 09/20/18 0001      AROM   Right/Left Shoulder  Right    Right Shoulder Flexion  130 Degrees    Right Shoulder ABduction  86 Degrees    Right Shoulder Internal Rotation  28 Degrees    Right Shoulder External Rotation  73 Degrees  [facility] Adult PT Treatment/Exercise - 09/20/18 0001      Shoulder Exercises: Standing   External Rotation  Both;20 reps;Theraband    Theraband Level (Shoulder External Rotation)  Level 1 (Yellow)    Internal Rotation  Right;20 reps;Theraband    Theraband Level (Shoulder Internal Rotation)  Level 1 (Yellow)    Extension  Both;20 reps;Theraband    Theraband Level (Shoulder Extension)  Level 1 (Yellow)    Row  Both;20 reps;Theraband    Theraband Level (Shoulder Row)  Level 1 (Yellow)    Other Standing Exercises  wall wash, wall circles, wand exercises with PT over pressure    Other Standing Exercises  back to wall reaching up with ball, no weight, W backs with PT overpressure      Shoulder Exercises: ROM/Strengthening   UBE (Upper Arm Bike)  level 2 x 5 minutes       Modalities   Modalities  Iontophoresis      Electrical Stimulation   Electrical Stimulation Location  right shoulder    Electrical Stimulation Action  IFC    Electrical Stimulation Parameters  sitting    Electrical Stimulation Goals  Pain      Iontophoresis   Type of Iontophoresis  Dexamethasone    Location  left anterior lateral shoulder    Dose  4m dose    Time  4 hour patch      Vasopneumatic   Number Minutes Vasopneumatic   15 minutes    Vasopnuematic Location   Shoulder    Vasopneumatic Pressure  Low    Vasopneumatic Temperature   36               PT Short Term Goals - 09/04/18 1634      PT SHORT TERM GOAL #1   Title  independent with initial HEP    Status  Achieved        PT Long Term Goals - 09/20/18 1134      PT LONG TERM GOAL #1   Title  understand proper posture and body mechanics    Status  Partially Met      PT LONG TERM GOAL #2   Title  increase right shoulder AROM to 120 degrees flexion    Status  Partially Met      PT LONG TERM GOAL #3   Title  decrease pain 50%    Status  On-going      PT LONG TERM GOAL #4   Title  report no difficulty dressing or doing her hair    Status  On-going            Plan - 09/20/18 1133    Clinical Impression Statement  Patient has had a very good increase in ROM for flexion and abduction, IR is still limited, she continues to report a higher rating of pain, she is very tender in the right anterior/lateral shoulder.  Tried ionto today    PT Next Visit Plan  see if the ionto helps    Consulted and Agree with Plan of Care  Patient       Patient will benefit from skilled therapeutic intervention in order to improve the following deficits and impairments:  Decreased range of motion, Impaired UE functional use, Increased muscle spasms, Pain, Impaired flexibility, Decreased strength, Postural dysfunction, Increased edema  Visit Diagnosis: Acute pain of right shoulder  Stiffness of right shoulder,  not elsewhere classified  Localized edema     Problem List Patient Active Problem List  Diagnosis Date Noted  . Essential hypertension 02/27/2016  . Hyperlipidemia 02/27/2016  . Tobacco abuse 02/27/2016  . Lumbago 11/07/2013  . Tear of rotator cuff 04/11/2013    Sumner Boast., PT 09/20/2018, 11:35 AM  [place] [address] [address] [place], [place], [address] Phone: [phone]   Fax:  [phone]  Name: Carrie Drake MRN: [id number] Date of Birth: [birth year]

## 2018-09-25 ENCOUNTER — Encounter: Payer: Self-pay | Admitting: Physical Therapy

## 2018-09-25 ENCOUNTER — Ambulatory Visit: Payer: Medicare Other | Admitting: Physical Therapy

## 2018-09-25 DIAGNOSIS — M25611 Stiffness of right shoulder, not elsewhere classified: Secondary | ICD-10-CM

## 2018-09-25 DIAGNOSIS — R600 Localized edema: Secondary | ICD-10-CM

## 2018-09-25 DIAGNOSIS — M25511 Pain in right shoulder: Secondary | ICD-10-CM | POA: Diagnosis not present

## 2018-09-25 NOTE — Therapy (Signed)
[facility] [address] [place], [place], [address] Phone: [phone]   Fax:  [phone]  Physical Therapy Treatment  Patient Details  Name: Carrie Drake MRN: [id number] Date of Birth: 1946/07/18 Referring Provider (PT): Esmond Plants   Encounter Date: 09/25/2018  PT End of Session - 09/25/18 1250    Visit Number  5    Date for PT Re-Evaluation  10/21/18    PT Start Time  1055    PT Stop Time  1145    PT Time Calculation (min)  50 min    Activity Tolerance  Patient tolerated treatment well    Behavior During Therapy  [facility] for tasks assessed/performed       Past Medical History:  Diagnosis Date  . Backache, unspecified   . Cancer ([place]) 5 yrs ago   squamous cell, right leg  . Complication of anesthesia    nausea & vomiting from Morphine  . Essential hypertension 02/27/2016  . GERD (gastroesophageal reflux disease)   . Hyperlipidemia 02/27/2016  . Hypothyroidism   . Lumbago 11/07/2013  . Migraine, unspecified, without mention of intractable migraine without mention of status migrainosus   . Other and unspecified hyperlipidemia   . Right rotator cuff tear   . Tobacco abuse 02/27/2016  . Tobacco use disorder   . Unspecified disorder of skin and subcutaneous tissue     Past Surgical History:  Procedure Laterality Date  . BACK SURGERY  yrs ago   lower back surgery x 2  . cervical neck surgery for C 2 fx  1997   plate inserted, limited neck motion to left  . CHOLECYSTECTOMY  yrs ago  . COLONOSCOPY WITH PROPOFOL N/A 06/10/2014   Procedure: COLONOSCOPY WITH PROPOFOL;  Surgeon: Garlan Fair, MD;  [facility];  Service: Endoscopy;  Laterality: N/A;  . COSMETIC SURGERY     FACE LIFT 2012  . ESOPHAGEAL MANOMETRY N/A 06/24/2014   Procedure: ESOPHAGEAL MANOMETRY (EM);  Surgeon: Garlan Fair, MD;  [facility];  Service: Endoscopy;  Laterality: N/A;  . ESOPHAGOGASTRODUODENOSCOPY (EGD)  WITH PROPOFOL N/A 06/10/2014   Procedure: ESOPHAGOGASTRODUODENOSCOPY (EGD) WITH PROPOFOL;  Surgeon: Garlan Fair, MD;  [facility];  Service: Endoscopy;  Laterality: N/A;  . LAMINECTOMY    . left foot bunion removed  yrs ago  . SHOULDER OPEN ROTATOR CUFF REPAIR Right 04/11/2013   Procedure: RIGHT ROTATOR CUFF REPAIR SHOULDER OPEN, ACROMIONECTOMY w/ GRAFT;  Surgeon: Tobi Bastos, MD;  [facility];  Service: Orthopedics;  Laterality: Right;  . TONSILLECTOMY      There were no vitals filed for this visit.  Subjective Assessment - 09/25/18 1148    Subjective  I think the patch may have helped    Currently in Pain?  Yes    Pain Score  2     Pain Location  Shoulder    Pain Orientation  Right;Anterior;Lateral    Pain Descriptors / Indicators  Sore    Aggravating Factors   movement                       [facility] Adult PT Treatment/Exercise - 09/25/18 0001      Modalities   Modalities  Electrical Stimulation;Vasopneumatic;Iontophoresis      Acupuncturist Location  right shoulder    Electrical Stimulation Action  IFC    Electrical Stimulation Parameters  sitting    Electrical Stimulation Goals  Pain      Iontophoresis   Type of Iontophoresis  Dexamethasone    Location  left anterior lateral shoulder    Dose  85m dose    Time  4 hour patch      Vasopneumatic   Number Minutes Vasopneumatic   15 minutes    Vasopnuematic Location   Shoulder    Vasopneumatic Pressure  Low    Vasopneumatic Temperature   36      Manual Therapy   Manual Therapy  Passive ROM    Passive ROM  all motions to end rane with gentle stretch               PT Short Term Goals - 09/04/18 1634      PT SHORT TERM GOAL #1   Title  independent with initial HEP    Status  Achieved        PT Long Term Goals - 09/25/18 1255      PT LONG TERM GOAL #1   Title  understand proper posture and body mechanics    Status  Partially Met       PT LONG TERM GOAL #4   Title  report no difficulty dressing or doing her hair    Status  On-going            Plan - 09/25/18 1250    Clinical Impression Statement  Patient with good ROM, she is very tender in teh right anterior/lateral shoulder.  Tolerates the PROM, does not tolerate the STM to this area.  She will doing a lot of driving and cooking over the holiday    PT Next Visit Plan  see how she is after the holiday and her increased activity    Consulted and Agree with Plan of Care  Patient       Patient will benefit from skilled therapeutic intervention in order to improve the following deficits and impairments:  Decreased range of motion, Impaired UE functional use, Increased muscle spasms, Pain, Impaired flexibility, Decreased strength, Postural dysfunction, Increased edema  Visit Diagnosis: Acute pain of right shoulder  Stiffness of right shoulder, not elsewhere classified  Localized edema     Problem List Patient Active Problem List   Diagnosis Date Noted  . Essential hypertension 02/27/2016  . Hyperlipidemia 02/27/2016  . Tobacco abuse 02/27/2016  . Lumbago 11/07/2013  . Tear of rotator cuff 04/11/2013    ASumner Boast, PT 09/25/2018, 12:56 PM  [address] [address]Phone: [phone]  Fax:  [phone] Name: Carrie PIESMRN: [id number] of Birth: 911-30-1947

## 2018-10-03 ENCOUNTER — Telehealth: Payer: Self-pay | Admitting: *Deleted

## 2018-10-03 NOTE — Telephone Encounter (Signed)
Clear trial follow up call [id number]

## 2018-11-08 ENCOUNTER — Encounter: Payer: Medicare Other | Admitting: *Deleted

## 2018-11-08 VITALS — BP 126/46 | HR 74

## 2018-11-08 DIAGNOSIS — Z006 Encounter for examination for normal comparison and control in clinical research program: Secondary | ICD-10-CM

## 2018-12-20 NOTE — Procedures (Signed)
Please disregard

## 2019-02-16 ENCOUNTER — Telehealth: Payer: Self-pay | Admitting: *Deleted

## 2019-02-16 NOTE — Telephone Encounter (Signed)
Phone follow up in the Clear Research study.  No aes or saes to report to sponsor.

## 2019-02-16 NOTE — Research (Signed)
Subject to research clinic for vist T10-M24 in the Clear Research study. No cos, aes or saes to report.

## 2019-05-09 ENCOUNTER — Telehealth: Payer: Self-pay | Admitting: *Deleted

## 2019-05-10 NOTE — Telephone Encounter (Signed)
Called patient for verbal consent to receive Clear study drug via [employer] on 05/09/2019 at 1610. Consent obtained and physical address verified. Conducted visit T12-M30 by phone. No cos, aes or saes to report.No change in medications.  Patient instructed to not take any of the new study drug delivered until follow up phone call to verify drug received acceptable for use. Pt verbalized understanding.

## 2019-08-21 ENCOUNTER — Telehealth: Payer: Self-pay | Admitting: *Deleted

## 2019-08-21 DIAGNOSIS — Z006 Encounter for examination for normal comparison and control in clinical research program: Secondary | ICD-10-CM

## 2019-08-21 NOTE — Telephone Encounter (Signed)
Spoke with subject for visit [phone] in the clear research study.  No aes or saes to report.  Scheduled next in person clinic visit.

## 2019-09-03 ENCOUNTER — Encounter: Payer: Self-pay | Admitting: Cardiology

## 2019-09-03 NOTE — Progress Notes (Signed)
Carrie Plant NP Reason for referral-Murmur  HPI: 73 year old female for evaluation of murmur at request of Eldridge Abrahams NP.  Patient seen previously by Dr. Oval Linsey for hyperlipidemia but not since April 2017.  Chest CT June 2018 showed three-vessel coronary atherosclerosis and calcification of the aortic valve.  There is also note of COPD.  Seen recently and felt to have a murmur and cardiology asked to evaluate.  She denies dyspnea on exertion, orthopnea, PND or syncope.  She occasionally has a brief pain in her left chest area radiating to the shoulder.  Last seconds and resolve spontaneously.  She does not have exertional chest pain.  Occasional minimal pedal edema left lower extremity.  Current Outpatient Medications  Medication Sig Dispense Refill  . AMBULATORY NON FORMULARY MEDICATION Take 180 mg by mouth daily. Medication Name:bempedoic acid vs placebo, CLEAR research study, drug provided    . aspirin EC 81 MG tablet Take 81 mg by mouth every morning.     . Calcium-Magnesium-Vitamin D (CALCIUM 1200+D3 PO) Take by mouth daily.    Marland Kitchen diltiazem (CARDIZEM SR) 90 MG 12 hr capsule Take 90 mg by mouth 2 (two) times daily.    . diphenhydrAMINE (SOMINEX) 25 MG tablet Take 25 mg by mouth at bedtime as needed for sleep.    Marland Kitchen ezetimibe (ZETIA) 10 MG tablet Take by mouth.    . lansoprazole (PREVACID) 30 MG capsule Take 30 mg by mouth every evening.     Marland Kitchen levothyroxine (SYNTHROID, LEVOTHROID) 50 MCG tablet Take 50 mcg by mouth daily before breakfast.     No current facility-administered medications for this visit.     Allergies  Allergen Reactions  . Morphine And Related Nausea And Vomiting  . Sulfa Antibiotics Anaphylaxis  . Tetracycline Hcl Hives    Bull-hives  . Atorvastatin Other (See Comments)    Jackhammer esophagus  . Isosorbide Other (See Comments)    dizziness  . Penicillins Hives  . Pravachol [Pravastatin Sodium]     Difficulty like with the Lipitor   . Tetracyclines  & Related Hives    Bull-hives     Past Medical History:  Diagnosis Date  . Backache, unspecified   . Cancer ([place]) 5 yrs ago   squamous cell, right leg  . Complication of anesthesia    nausea & vomiting from Morphine  . Essential hypertension 02/27/2016  . GERD (gastroesophageal reflux disease)   . Hyperlipidemia 02/27/2016  . Hypothyroidism   . Lumbago 11/07/2013  . Migraine, unspecified, without mention of intractable migraine without mention of status migrainosus   . Right rotator cuff tear   . Tobacco abuse 02/27/2016  . Unspecified disorder of skin and subcutaneous tissue     Past Surgical History:  Procedure Laterality Date  . BACK SURGERY  yrs ago   lower back surgery x 2  . cervical neck surgery for C 2 fx  1997   plate inserted, limited neck motion to left  . CHOLECYSTECTOMY  yrs ago  . COLONOSCOPY WITH PROPOFOL N/A 06/10/2014   Procedure: COLONOSCOPY WITH PROPOFOL;  Surgeon: Garlan Fair, MD;  [facility];  Service: Endoscopy;  Laterality: N/A;  . COSMETIC SURGERY     FACE LIFT 2012  . ESOPHAGEAL MANOMETRY N/A 06/24/2014   Procedure: ESOPHAGEAL MANOMETRY (EM);  Surgeon: Garlan Fair, MD;  [facility];  Service: Endoscopy;  Laterality: N/A;  . ESOPHAGOGASTRODUODENOSCOPY (EGD) WITH PROPOFOL N/A 06/10/2014   Procedure: ESOPHAGOGASTRODUODENOSCOPY (EGD) WITH PROPOFOL;  Surgeon: Ursula Alert  Wynetta Emery, MD;  Location: [facility];  Service: Endoscopy;  Laterality: N/A;  . LAMINECTOMY    . left foot bunion removed  yrs ago  . SHOULDER OPEN ROTATOR CUFF REPAIR Right 04/11/2013   Procedure: RIGHT ROTATOR CUFF REPAIR SHOULDER OPEN, ACROMIONECTOMY w/ GRAFT;  Surgeon: Tobi Bastos, MD;  [facility];  Service: Orthopedics;  Laterality: Right;  . TONSILLECTOMY      Social History   Socioeconomic History  . Marital status: Divorced    Spouse name: Not on file  . Number of children: 2  . Years of education: college-4  . Highest education level: Not  on file  Occupational History  . Occupation: Retired    Fish farm manager: RETIRED  Social Needs  . Financial resource strain: Not on file  . Food insecurity    Worry: Not on file    Inability: Not on file  . Transportation needs    Medical: Not on file    Non-medical: Not on file  Tobacco Use  . Smoking status: Current Every Day Smoker    Packs/day: 0.50    Years: 30.00    Pack years: 15.00    Types: Cigarettes  . Smokeless tobacco: Never Used  Substance and Sexual Activity  . Alcohol use: Yes    Comment: occasional  . Drug use: No  . Sexual activity: Not on file  Lifestyle  . Physical activity    Days per week: Not on file    Minutes per session: Not on file  . Stress: Not on file  Relationships  . Social Herbalist on phone: Not on file    Gets together: Not on file    Attends religious service: Not on file    Active member of club or organization: Not on file    Attends meetings of clubs or organizations: Not on file    Relationship status: Not on file  . Intimate partner violence    Fear of current or ex partner: Not on file    Emotionally abused: Not on file    Physically abused: Not on file    Forced sexual activity: Not on file  Other Topics Concern  . Not on file  Social History Narrative  . Not on file    Family History  Problem Relation Age of Onset  . Alzheimer's disease Mother   . Heart attack Mother   . Diabetes Father   . Stroke Father   . Hyperlipidemia Father   . Diabetes Brother   . Heart attack Brother   . Hyperlipidemia Brother   . Hyperlipidemia Sister     ROS: Occasional hematochezia but no fevers or chills, productive cough, hemoptysis, dysphasia, odynophagia, melena, hematochezia, dysuria, hematuria, rash, seizure activity, orthopnea, PND, claudication. Remaining systems are negative.  Physical Exam:   Blood pressure 140/70, pulse 72, height 4\' 11"  (1.499 m), weight 129 lb (58.5 kg), SpO2 99 %.  General:  Well developed/well  nourished in NAD Skin warm/dry Patient not depressed No peripheral clubbing Back-normal HEENT-normal/normal eyelids Neck supple/normal carotid upstroke bilaterally; no bruits; no JVD; no thyromegaly chest - CTA/ normal expansion CV - RRR/normal S1 and S2; no murmurs, rubs or gallops;  PMI nondisplaced Abdomen -NT/ND, no HSM, no mass, + bowel sounds, no bruit 2+ femoral pulses, no bruits Ext-no edema, chords, 2+ DP Neuro-grossly nonfocal  ECG -sinus rhythm at a rate of 72, no ST changes.  Personally reviewed  A/P  1 murmur -no significant murmur on examination.  We will arrange an echocardiogram to quantify LV function given atypical chest pain.  2 CAD-based on calcification in coronary arteries noted on CT scan.  Continue aspirin.  She is intolerant to statins.  Continue Zetia and Clear study drug.  3 hyperlipidemia-continue Zetia and Clear study drug.  4 tobacco abuse-patient counseled on discontinuing.   5 atypical chest pain-electrocardiogram shows no ST changes.  She does have coronary artery disease based on calcification noted on CT scan.  We will arrange a [place] nuclear study to screen for ischemia.  Kirk Ruths, MD

## 2019-09-05 ENCOUNTER — Encounter: Payer: Self-pay | Admitting: Cardiology

## 2019-09-05 ENCOUNTER — Ambulatory Visit (INDEPENDENT_AMBULATORY_CARE_PROVIDER_SITE_OTHER): Payer: Medicare Other | Admitting: Cardiology

## 2019-09-05 ENCOUNTER — Other Ambulatory Visit: Payer: Self-pay

## 2019-09-05 VITALS — BP 140/70 | HR 72 | Ht 59.0 in | Wt 129.0 lb

## 2019-09-05 DIAGNOSIS — R072 Precordial pain: Secondary | ICD-10-CM

## 2019-09-05 DIAGNOSIS — I2510 Atherosclerotic heart disease of native coronary artery without angina pectoris: Secondary | ICD-10-CM

## 2019-09-05 DIAGNOSIS — Z720 Tobacco use: Secondary | ICD-10-CM

## 2019-09-05 DIAGNOSIS — R011 Cardiac murmur, unspecified: Secondary | ICD-10-CM

## 2019-09-05 NOTE — Patient Instructions (Signed)
Medication Instructions:  NO CHANGE *If you need a refill on your cardiac medications before your next appointment, please call your pharmacy*  Lab Work: If you have labs (blood work) drawn today and your tests are completely normal, you will receive your results only by: Marland Kitchen MyChart Message (if you have MyChart) OR . A paper copy in the mail If you have any lab test that is abnormal or we need to change your treatment, we will call you to review the results.  Testing/Procedures: Your physician has requested that you have an echocardiogram. Echocardiography is a painless test that uses sound waves to create images of your heart. It provides your doctor with information about the size and shape of your heart and how well your heart's chambers and valves are working. This procedure takes approximately one hour. There are no restrictions for this procedure.[facility] has requested that you have a lexiscan myoview. For further information please visit [email or web address]. Please follow instruction sheet, as given.[email or web address]    Follow-Up: At [facility], you and your health needs are our priority.  As part of our continuing mission to provide you with exceptional heart care, we have created designated Provider Care Teams.  These Care Teams include your primary Cardiologist (physician) and Advanced Practice Providers (APPs -  Physician Assistants and Nurse Practitioners) who all work together to provide you with the care you need, when you need it.  Your next appointment:   12 months  The format for your next appointment:   In Person  Provider:   Kirk Ruths, MD

## 2019-09-13 ENCOUNTER — Telehealth (HOSPITAL_COMMUNITY): Payer: Self-pay

## 2019-09-13 NOTE — Telephone Encounter (Signed)
Spoke with the patient, instructions given. She stated that she would be here for her test. Asked to call back with any questions. EMTP 

## 2019-09-18 ENCOUNTER — Other Ambulatory Visit: Payer: Self-pay

## 2019-09-18 ENCOUNTER — Ambulatory Visit (HOSPITAL_BASED_OUTPATIENT_CLINIC_OR_DEPARTMENT_OTHER): Payer: Medicare Other

## 2019-09-18 ENCOUNTER — Ambulatory Visit (HOSPITAL_COMMUNITY): Payer: Medicare Other | Attending: Cardiology

## 2019-09-18 DIAGNOSIS — I2510 Atherosclerotic heart disease of native coronary artery without angina pectoris: Secondary | ICD-10-CM | POA: Diagnosis not present

## 2019-09-18 DIAGNOSIS — R011 Cardiac murmur, unspecified: Secondary | ICD-10-CM | POA: Diagnosis not present

## 2019-09-18 LAB — MYOCARDIAL PERFUSION IMAGING
LV dias vol: 39 mL (ref 46–106)
LV sys vol: 11 mL
Peak HR: 96 {beats}/min
Rest HR: 63 {beats}/min
SDS: 3
SRS: 4
SSS: 7
TID: 0.97

## 2019-09-18 LAB — ECHOCARDIOGRAM COMPLETE
Height: 59 in
Weight: 2064 oz

## 2019-09-18 MED ORDER — TECHNETIUM TC 99M TETROFOSMIN IV KIT
32.0000 | PACK | Freq: Once | INTRAVENOUS | Status: AC | PRN
  Administered 2019-09-18: 32 via INTRAVENOUS
  Filled 2019-09-18: qty 32

## 2019-09-18 MED ORDER — TECHNETIUM TC 99M TETROFOSMIN IV KIT
10.6000 | PACK | Freq: Once | INTRAVENOUS | Status: AC | PRN
  Administered 2019-09-18: 10.6 via INTRAVENOUS
  Filled 2019-09-18: qty 11

## 2019-09-18 MED ORDER — REGADENOSON 0.4 MG/5ML IV SOLN
0.4000 mg | Freq: Once | INTRAVENOUS | Status: AC
  Administered 2019-09-18: 0.4 mg via INTRAVENOUS

## 2019-11-06 ENCOUNTER — Encounter: Payer: Medicare Other | Admitting: *Deleted

## 2019-11-06 ENCOUNTER — Other Ambulatory Visit: Payer: Self-pay

## 2019-11-06 VITALS — BP 129/52 | HR 76 | Wt 127.8 lb

## 2019-11-06 DIAGNOSIS — Z006 Encounter for examination for normal comparison and control in clinical research program: Secondary | ICD-10-CM

## 2019-11-06 NOTE — Research (Signed)
Patient to research clinic for visit [phone] in the clear research study.  No aes or saes to report. New drug dispensed.  Next phone and clinic visits scheduled.

## 2020-05-06 ENCOUNTER — Encounter: Payer: Medicare Other | Admitting: *Deleted

## 2020-05-06 ENCOUNTER — Other Ambulatory Visit: Payer: Self-pay

## 2020-05-06 VITALS — BP 139/50 | HR 78 | Wt 130.4 lb

## 2020-05-06 DIAGNOSIS — Z006 Encounter for examination for normal comparison and control in clinical research program: Secondary | ICD-10-CM

## 2020-05-06 NOTE — Research (Signed)
Patient was re-consented to US Version 8.1 14Jan2021, Local Version 06Apr2021  Subject Name: Carrie Drake  Subject met inclusion and exclusion criteria.  The informed consent form, study requirements and expectations were reviewed with the subject and questions and concerns were addressed prior to the signing of the consent form.  The subject verbalized understanding of the trial requirements.  The subject agreed to participate in the CLEAR trial and signed the informed consent at 0911 on 05/06/20  The informed consent was obtained prior to performance of any protocol-specific procedures for the subject.  A copy of the signed informed consent was given to the subject and a copy was placed in the subject's medical record.   Young, Rachel C    Patient was seen in clinic today for CLEAR visit T16,M42. No AE's/SAE's to report. No new medication changes. Patient has decided today that after this visit she would no longer like to come in for appointments or take IP mediation. She will continue in study on telephone calls only.  

## 2020-06-26 NOTE — Addendum Note (Signed)
Addended by: Sandie Ano D on: 06/26/2020 09:49 AM   Modules accepted: Orders

## 2020-07-23 ENCOUNTER — Encounter: Payer: Self-pay | Admitting: *Deleted

## 2020-07-23 NOTE — Progress Notes (Signed)
Completed phone visit/medical record check for T17,  M45 of the CLEAR research study. Subject is no longer on IP and is followed by telephone calls/medical record checks only. All concomitant medications have been reviewed and updated. No new AE's/SAE's to report to sponsor at this time. Will conduct next phone call/chart check in approximately 3 months.

## 2020-11-04 ENCOUNTER — Telehealth: Payer: Self-pay

## 2020-11-04 DIAGNOSIS — Z006 Encounter for examination for normal comparison and control in clinical research program: Secondary | ICD-10-CM

## 2020-11-04 NOTE — Telephone Encounter (Signed)
Called subject for CLEAR T18 M48. Left voicemail asking for return call. Medical chart review completed.

## 2020-11-11 ENCOUNTER — Encounter: Payer: Self-pay | Admitting: *Deleted

## 2020-11-11 NOTE — Progress Notes (Signed)
Completed phone call/medical chart review for CLEAR visit T18,M48. There are no new AE's or SAE's to report to sponsor at this time. All concomitant medications have been reviewed and updated if applicable. Next phone call and chart review will be in approximately 3 months.

## 2021-02-11 ENCOUNTER — Telehealth: Payer: Self-pay | Admitting: *Deleted

## 2021-02-11 NOTE — Telephone Encounter (Signed)
Completed telephone call/medical chart review for visit T19,M51 for the CLEAR research study. All concomitant medications have been reviewed and updated if applicable. There are no new AE's or SAE's to report to sponsor at this time. Next call will be the patient's End of Study call which will be in June 2022.

## 2021-04-07 ENCOUNTER — Encounter (HOSPITAL_COMMUNITY): Payer: Self-pay

## 2021-04-07 ENCOUNTER — Emergency Department (HOSPITAL_COMMUNITY): Payer: Medicare Other

## 2021-04-07 ENCOUNTER — Inpatient Hospital Stay (HOSPITAL_COMMUNITY)
Admission: EM | Admit: 2021-04-07 | Discharge: 2021-04-09 | DRG: 149 | Disposition: A | Discharge status: Home Health | Payer: Medicare Other | Attending: Internal Medicine | Admitting: Internal Medicine

## 2021-04-07 ENCOUNTER — Other Ambulatory Visit: Payer: Self-pay

## 2021-04-07 DIAGNOSIS — E785 Hyperlipidemia, unspecified: Secondary | ICD-10-CM | POA: Diagnosis present

## 2021-04-07 DIAGNOSIS — I2510 Atherosclerotic heart disease of native coronary artery without angina pectoris: Secondary | ICD-10-CM | POA: Diagnosis present

## 2021-04-07 DIAGNOSIS — Z79890 Hormone replacement therapy (postmenopausal): Secondary | ICD-10-CM

## 2021-04-07 DIAGNOSIS — Z79899 Other long term (current) drug therapy: Secondary | ICD-10-CM

## 2021-04-07 DIAGNOSIS — E039 Hypothyroidism, unspecified: Secondary | ICD-10-CM | POA: Diagnosis present

## 2021-04-07 DIAGNOSIS — Z8349 Family history of other endocrine, nutritional and metabolic diseases: Secondary | ICD-10-CM

## 2021-04-07 DIAGNOSIS — R197 Diarrhea, unspecified: Secondary | ICD-10-CM | POA: Diagnosis present

## 2021-04-07 DIAGNOSIS — D72829 Elevated white blood cell count, unspecified: Secondary | ICD-10-CM | POA: Diagnosis present

## 2021-04-07 DIAGNOSIS — Z7982 Long term (current) use of aspirin: Secondary | ICD-10-CM

## 2021-04-07 DIAGNOSIS — R42 Dizziness and giddiness: Principal | ICD-10-CM

## 2021-04-07 DIAGNOSIS — Z8673 Personal history of transient ischemic attack (TIA), and cerebral infarction without residual deficits: Secondary | ICD-10-CM

## 2021-04-07 DIAGNOSIS — Z881 Allergy status to other antibiotic agents status: Secondary | ICD-10-CM

## 2021-04-07 DIAGNOSIS — M75101 Unspecified rotator cuff tear or rupture of right shoulder, not specified as traumatic: Secondary | ICD-10-CM | POA: Diagnosis present

## 2021-04-07 DIAGNOSIS — Z880 Allergy status to penicillin: Secondary | ICD-10-CM

## 2021-04-07 DIAGNOSIS — Z882 Allergy status to sulfonamides status: Secondary | ICD-10-CM

## 2021-04-07 DIAGNOSIS — Z820 Family history of epilepsy and other diseases of the nervous system: Secondary | ICD-10-CM

## 2021-04-07 DIAGNOSIS — K219 Gastro-esophageal reflux disease without esophagitis: Secondary | ICD-10-CM | POA: Diagnosis present

## 2021-04-07 DIAGNOSIS — Z885 Allergy status to narcotic agent status: Secondary | ICD-10-CM

## 2021-04-07 DIAGNOSIS — Z888 Allergy status to other drugs, medicaments and biological substances status: Secondary | ICD-10-CM

## 2021-04-07 DIAGNOSIS — F17210 Nicotine dependence, cigarettes, uncomplicated: Secondary | ICD-10-CM | POA: Diagnosis present

## 2021-04-07 DIAGNOSIS — Z20822 Contact with and (suspected) exposure to covid-19: Secondary | ICD-10-CM | POA: Diagnosis present

## 2021-04-07 DIAGNOSIS — F419 Anxiety disorder, unspecified: Secondary | ICD-10-CM | POA: Diagnosis present

## 2021-04-07 DIAGNOSIS — Z833 Family history of diabetes mellitus: Secondary | ICD-10-CM

## 2021-04-07 DIAGNOSIS — Z8249 Family history of ischemic heart disease and other diseases of the circulatory system: Secondary | ICD-10-CM

## 2021-04-07 DIAGNOSIS — I10 Essential (primary) hypertension: Secondary | ICD-10-CM | POA: Diagnosis present

## 2021-04-07 DIAGNOSIS — Z823 Family history of stroke: Secondary | ICD-10-CM

## 2021-04-07 LAB — COMPREHENSIVE METABOLIC PANEL
ALT: 21 U/L (ref 0–44)
AST: 20 U/L (ref 15–41)
Albumin: 4.3 g/dL (ref 3.5–5.0)
Alkaline Phosphatase: 51 U/L (ref 38–126)
Anion gap: 11 (ref 5–15)
BUN: 16 mg/dL (ref 8–23)
CO2: 23 mmol/L (ref 22–32)
Calcium: 9.5 mg/dL (ref 8.9–10.3)
Chloride: 105 mmol/L (ref 98–111)
Creatinine, Ser: 0.95 mg/dL (ref 0.44–1.00)
GFR, Estimated: >60 mL/min (ref >60)
Glucose, Bld: 146 mg/dL — ABNORMAL HIGH (ref 70–99)
Potassium: 3.5 mmol/L (ref 3.5–5.1)
Sodium: 139 mmol/L (ref 135–145)
Total Bilirubin: 0.5 mg/dL (ref 0.3–1.2)
Total Protein: 7.4 g/dL (ref 6.5–8.1)

## 2021-04-07 LAB — CBC
HCT: 45 % (ref 36.0–46.0)
Hemoglobin: 15.2 g/dL — ABNORMAL HIGH (ref 12.0–15.0)
MCH: 33.2 pg (ref 26.0–34.0)
MCHC: 33.8 g/dL (ref 30.0–36.0)
MCV: 98.3 fL (ref 80.0–100.0)
Platelets: 270 10*3/uL (ref 150–400)
RBC: 4.58 MIL/uL (ref 3.87–5.11)
RDW: 13.1 % (ref 11.5–15.5)
WBC: 15.6 10*3/uL — ABNORMAL HIGH (ref 4.0–10.5)
nRBC: 0 % (ref 0.0–0.2)

## 2021-04-07 LAB — LIPASE, BLOOD: Lipase: 26 U/L (ref 11–51)

## 2021-04-07 MED ORDER — SODIUM CHLORIDE 0.9 % IV BOLUS
500.0000 mL | Freq: Once | INTRAVENOUS | Status: AC
  Administered 2021-04-07: 500 mL via INTRAVENOUS

## 2021-04-07 MED ORDER — SODIUM CHLORIDE 0.9 % IV SOLN: INTRAVENOUS | Status: DC

## 2021-04-07 MED ORDER — LORAZEPAM 2 MG/ML IJ SOLN
1.0000 mg | Freq: Once | INTRAMUSCULAR | Status: AC
  Administered 2021-04-07: 1 mg via INTRAVENOUS
  Filled 2021-04-07: qty 1

## 2021-04-07 NOTE — ED Notes (Signed)
Pt currently in MRI

## 2021-04-07 NOTE — ED Provider Notes (Signed)
[facility] DEPT Provider Note   CSN: [id number] Arrival date & time: 04/07/21  2037     History Chief Complaint  Patient presents with  . Emesis  . Dizziness    Carrie Drake is a 75 y.o. female.  HPI She reports an onset of dizziness characterized by spinning sensation, nausea/vomiting and diarrhea, after eating lasagna at a local restaurant tonight.  Her sister ate the same food and did not get sick.  Patient denies fever, blood in emesis or stool, focal weakness or paresthesia.  She came here for evaluation by EMS.  She was treated with Zofran with cessation of dizziness, during transport.  She denies other recent illnesses.  She denies shortness of breath, cough or chest pain at this time.  No prior history of vertigo.  There are no other known active modifying factors.    Past Medical History:  Diagnosis Date  . Backache, unspecified   . 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 Complication of anesthesia    nausea & vomiting from Morphine  . Essential hypertension 02/27/2016  . GERD (gastroesophageal reflux disease)   . Hyperlipidemia 02/27/2016  . Hypothyroidism   . Lumbago 11/07/2013  . Migraine, unspecified, without mention of intractable migraine without mention of status migrainosus   . Right rotator cuff tear   . Tobacco abuse 02/27/2016  . Unspecified disorder of skin and subcutaneous tissue     Patient Active Problem List   Diagnosis Date Noted  . Essential hypertension 02/27/2016  . Hyperlipidemia 02/27/2016  . Tobacco abuse 02/27/2016  . Lumbago 11/07/2013  . Tear of rotator cuff 04/11/2013    Past Surgical History:  Procedure Laterality Date  . BACK SURGERY  yrs ago   lower back surgery x 2  . cervical neck surgery for C 2 fx  1997   plate inserted, limited neck motion to left  . CHOLECYSTECTOMY  yrs ago  . COLONOSCOPY WITH PROPOFOL N/A 06/10/2014   Procedure: COLONOSCOPY WITH PROPOFOL;  Surgeon: Garlan Fair, MD;  Location: [facility];  Service: Endoscopy;  Laterality: N/A;  . COSMETIC SURGERY     FACE LIFT 2012  . ESOPHAGEAL MANOMETRY N/A 06/24/2014   Procedure: ESOPHAGEAL MANOMETRY (EM);  Surgeon: Garlan Fair, MD;  Location: [facility];  Service: Endoscopy;  Laterality: N/A;  . ESOPHAGOGASTRODUODENOSCOPY (EGD) WITH PROPOFOL N/A 06/10/2014   Procedure: ESOPHAGOGASTRODUODENOSCOPY (EGD) WITH PROPOFOL;  Surgeon: Garlan Fair, MD;  Location: [facility];  Service: Endoscopy;  Laterality: N/A;  . LAMINECTOMY    . left foot bunion removed  yrs ago  . SHOULDER OPEN ROTATOR CUFF REPAIR Right 04/11/2013   Procedure: RIGHT ROTATOR CUFF REPAIR SHOULDER OPEN, ACROMIONECTOMY w/ GRAFT;  Surgeon: Tobi Bastos, MD;  Location: [facility];  Service: Orthopedics;  Laterality: Right;  . TONSILLECTOMY       OB History   No obstetric history on file.     Family History  Problem Relation Age of Onset  . Alzheimer's disease Mother   . Heart attack Mother   . Diabetes Father   . Stroke Father   . Hyperlipidemia Father   . Diabetes Brother   . Heart attack Brother   . Hyperlipidemia Brother   . Hyperlipidemia Sister     Social History   Tobacco Use  . Smoking status: Current Every Day Smoker    Packs/day: 1.00    Years: 30.00    Pack years: 30.00  Types: Cigarettes  . Smokeless tobacco: Never Used  Substance Use Topics  . Alcohol use: Yes    Comment: occasional  . Drug use: No    Home Medications Prior to Admission medications   Medication Sig Start Date End Date Taking? Authorizing Provider  aspirin EC 81 MG tablet Take 81 mg by mouth every morning.     [provider]  Calcium-Magnesium-Vitamin D (CALCIUM 1200+D3 PO) Take by mouth daily.    [provider]  diltiazem (CARDIZEM SR) 90 MG 12 hr capsule Take 90 mg by mouth 2 (two) times daily.    [provider]  diphenhydrAMINE (SOMINEX) 25 MG tablet Take 25 mg by mouth at bedtime as needed for  sleep.    [provider]  ezetimibe (ZETIA) 10 MG tablet Take by mouth. 05/03/19   [provider]  lansoprazole (PREVACID) 30 MG capsule Take 30 mg by mouth every evening.     [provider]  levothyroxine (SYNTHROID, LEVOTHROID) 50 MCG tablet Take 50 mcg by mouth daily before breakfast.    [provider]    Allergies    Morphine and related, Sulfa antibiotics, Tetracycline hcl, Atorvastatin, Isosorbide, Penicillins, Pravachol [pravastatin sodium], and Tetracyclines & related  Review of Systems   Review of Systems  All other systems reviewed and are negative.   Physical Exam Updated Vital Signs BP (!) 155/83 (BP Location: Right Arm)   Pulse 93   Temp 97.6 F (36.4 C) (Oral)   Resp 16   Ht 4\' 11"  (1.499 m)   Wt 55.3 kg   SpO2 97%   BMI 24.64 kg/m   Physical Exam Vitals and nursing note reviewed.  Constitutional:      General: She is not in acute distress.    Appearance: She is well-developed. She is not ill-appearing, toxic-appearing or diaphoretic.  HENT:     Head: Normocephalic and atraumatic.     Right Ear: External ear normal.     Left Ear: External ear normal.     Nose: No congestion.     Mouth/Throat:     Pharynx: No oropharyngeal exudate.  Eyes:     Conjunctiva/sclera: Conjunctivae normal.     Pupils: Pupils are equal, round, and reactive to light.  Neck:     Trachea: Phonation normal.  Cardiovascular:     Rate and Rhythm: Normal rate and regular rhythm.     Heart sounds: Normal heart sounds.  Pulmonary:     Effort: Pulmonary effort is normal.     Breath sounds: Normal breath sounds.  Abdominal:     General: There is no distension.     Palpations: Abdomen is soft.     Tenderness: There is no abdominal tenderness.  Musculoskeletal:        General: Normal range of motion.     Cervical back: Normal range of motion and neck supple.  Skin:    General: Skin is warm and dry.  Neurological:     Mental Status: She is alert  and oriented to person, place, and time.     Cranial Nerves: No cranial nerve deficit.     Sensory: No sensory deficit.     Motor: No abnormal muscle tone.     Coordination: Coordination normal.     Comments: No dysarthria or aphasia.  No pronator drift.  There is inducible nystagmus, with head impulse testing.  With eye movement, there is no persistent nystagmus.  Psychiatric:        Mood and  Affect: Mood normal.        Behavior: Behavior normal.        Thought Content: Thought content normal.        Judgment: Judgment normal.     ED Results / Procedures / Treatments   Labs (all labs ordered are listed, but only abnormal results are displayed) Labs Reviewed  COMPREHENSIVE METABOLIC PANEL - Abnormal; Notable for the following components:      Result Value   Glucose, Bld 146 (*)    All other components within normal limits  CBC - Abnormal; Notable for the following components:   WBC 15.6 (*)    Hemoglobin 15.2 (*)    All other components within normal limits  LIPASE, BLOOD  URINALYSIS, ROUTINE W REFLEX MICROSCOPIC    EKG None  Radiology No results found.  Procedures Procedures   Medications Ordered in ED Medications  0.9 %  sodium chloride infusion (has no administration in time range)  sodium chloride 0.9 % bolus 500 mL (500 mLs Intravenous Bolus from Bag 04/07/21 2138)  LORazepam (ATIVAN) injection 1 mg (1 mg Intravenous Given 04/07/21 2135)    ED Course  I have reviewed the triage vital signs and the nursing notes.  Pertinent labs & imaging results that were available during my care of the patient were reviewed by me and considered in my medical decision making (see chart for details).    MDM Rules/Calculators/A&P                           Patient Vitals for the past 24 hrs:  BP Temp Temp src Pulse Resp SpO2 Height Weight  04/07/21 2322 (!) 155/83 97.6 F (36.4 C) Oral 93 16 97 % -- --  04/07/21 2049 -- -- -- -- -- -- 4\' 11"  (1.499 m) 55.3 kg    11:34 PM  Reevaluation with update and discussion. After initial assessment and treatment, an updated evaluation reveals she denies dizziness at this time.  No additional complaints.  Awaiting MRI results.Daleen Bo   Medical Decision Making:  This patient is presenting for evaluation of dizziness, vomiting, diarrhea, which does require a range of treatment options, and is a complaint that involves a high risk of morbidity and mortality. The differential diagnoses include peripheral vertigo, central vertigo, food poisoning, gastroenteritis. I decided to review old records, and in summary elderly female presenting with symptoms after eating at a restaurant..  I did not require additional historical information from anyone.  Clinical Laboratory Tests Ordered, included CBC, Metabolic panel and Lipase. Review indicates normal findings, except white count elevated. Radiologic Tests Ordered, included MRI brain.      Critical Interventions-medical evaluation, laboratory testing, medication treatment, IV fluids, observation reassessment  After These Interventions, the Patient was reevaluated and was found improved after period of observation.  MRI ordered to evaluate for stroke.  CRITICAL CARE-no Performed by: Daleen Bo  Nursing Notes Reviewed/ Care Coordinated Applicable Imaging Reviewed Interpretation of Laboratory Data incorporated into ED treatment   11:40 PM-care to oncoming provider team to evaluate after MRI imaging returns.    Final Clinical Impression(s) / ED Diagnoses Final diagnoses:  Dizziness    Rx / DC Orders ED Discharge Orders    None       Daleen Bo, MD 04/07/21 2339

## 2021-04-07 NOTE — ED Triage Notes (Addendum)
Pt BIB EMS from home. Pt had sudden onset onset of vertigo, n/v/d after returning home from eating out. EMS gave 4mg  zofran with no relief.

## 2021-04-08 ENCOUNTER — Encounter (HOSPITAL_COMMUNITY): Payer: Self-pay | Admitting: Family Medicine

## 2021-04-08 DIAGNOSIS — I10 Essential (primary) hypertension: Secondary | ICD-10-CM | POA: Diagnosis not present

## 2021-04-08 DIAGNOSIS — D72829 Elevated white blood cell count, unspecified: Secondary | ICD-10-CM | POA: Diagnosis present

## 2021-04-08 DIAGNOSIS — E039 Hypothyroidism, unspecified: Secondary | ICD-10-CM | POA: Diagnosis present

## 2021-04-08 DIAGNOSIS — I2510 Atherosclerotic heart disease of native coronary artery without angina pectoris: Secondary | ICD-10-CM

## 2021-04-08 DIAGNOSIS — R42 Dizziness and giddiness: Secondary | ICD-10-CM | POA: Diagnosis not present

## 2021-04-08 DIAGNOSIS — Z8673 Personal history of transient ischemic attack (TIA), and cerebral infarction without residual deficits: Secondary | ICD-10-CM

## 2021-04-08 LAB — URINALYSIS, ROUTINE W REFLEX MICROSCOPIC
Bilirubin Urine: NEGATIVE
Glucose, UA: 50 mg/dL — AB
Ketones, ur: 5 mg/dL — AB
Leukocytes,Ua: NEGATIVE
Nitrite: NEGATIVE
Protein, ur: NEGATIVE mg/dL
Specific Gravity, Urine: 1.014 (ref 1.005–1.030)
pH: 7 (ref 5.0–8.0)

## 2021-04-08 LAB — RESP PANEL BY RT-PCR (FLU A&B, COVID) ARPGX2
Influenza A by PCR: NEGATIVE
Influenza B by PCR: NEGATIVE
SARS Coronavirus 2 by RT PCR: NEGATIVE

## 2021-04-08 MED ORDER — DIPHENHYDRAMINE HCL 50 MG/ML IJ SOLN
25.0000 mg | Freq: Once | INTRAMUSCULAR | Status: AC
  Administered 2021-04-08: 25 mg via INTRAVENOUS
  Filled 2021-04-08: qty 1

## 2021-04-08 MED ORDER — PANTOPRAZOLE SODIUM 40 MG PO TBEC
40.0000 mg | DELAYED_RELEASE_TABLET | Freq: Every day | ORAL | Status: DC
  Administered 2021-04-08: 40 mg via ORAL
  Filled 2021-04-08: qty 1

## 2021-04-08 MED ORDER — EZETIMIBE 10 MG PO TABS
10.0000 mg | ORAL_TABLET | Freq: Every day | ORAL | Status: DC
  Administered 2021-04-09: 10 mg via ORAL
  Filled 2021-04-08 (×2): qty 1

## 2021-04-08 MED ORDER — PROMETHAZINE HCL 25 MG PO TABS: 12.5000 mg | ORAL_TABLET | Freq: Four times a day (QID) | ORAL | Status: DC | PRN

## 2021-04-08 MED ORDER — ASPIRIN EC 81 MG PO TBEC
81.0000 mg | DELAYED_RELEASE_TABLET | Freq: Every day | ORAL | Status: DC
  Administered 2021-04-08: 81 mg via ORAL
  Filled 2021-04-08: qty 1

## 2021-04-08 MED ORDER — ACETAMINOPHEN 325 MG PO TABS: 650.0000 mg | ORAL_TABLET | Freq: Four times a day (QID) | ORAL | Status: DC | PRN

## 2021-04-08 MED ORDER — ACETAMINOPHEN 650 MG RE SUPP: 650.0000 mg | Freq: Four times a day (QID) | RECTAL | Status: DC | PRN

## 2021-04-08 MED ORDER — ENOXAPARIN SODIUM 40 MG/0.4ML IJ SOSY
40.0000 mg | PREFILLED_SYRINGE | INTRAMUSCULAR | Status: DC
  Administered 2021-04-08 – 2021-04-09 (×2): 40 mg via SUBCUTANEOUS
  Filled 2021-04-08 (×3): qty 0.4

## 2021-04-08 MED ORDER — DILTIAZEM HCL ER 90 MG PO CP12
90.0000 mg | ORAL_CAPSULE | Freq: Two times a day (BID) | ORAL | Status: DC
  Administered 2021-04-08 – 2021-04-09 (×3): 90 mg via ORAL
  Filled 2021-04-08 (×4): qty 1

## 2021-04-08 MED ORDER — ONDANSETRON HCL 4 MG/2ML IJ SOLN
4.0000 mg | Freq: Four times a day (QID) | INTRAMUSCULAR | Status: DC | PRN
  Administered 2021-04-08: 4 mg via INTRAVENOUS
  Filled 2021-04-08: qty 2

## 2021-04-08 MED ORDER — LEVOTHYROXINE SODIUM 50 MCG PO TABS
50.0000 ug | ORAL_TABLET | Freq: Every day | ORAL | Status: DC
  Administered 2021-04-08 – 2021-04-09 (×2): 50 ug via ORAL
  Filled 2021-04-08 (×2): qty 1

## 2021-04-08 MED ORDER — LISINOPRIL 5 MG PO TABS
5.0000 mg | ORAL_TABLET | Freq: Every day | ORAL | Status: DC
  Administered 2021-04-08: 5 mg via ORAL
  Filled 2021-04-08: qty 1

## 2021-04-08 MED ORDER — MECLIZINE HCL 25 MG PO TABS
25.0000 mg | ORAL_TABLET | Freq: Three times a day (TID) | ORAL | Status: DC | PRN
  Administered 2021-04-08: 25 mg via ORAL
  Filled 2021-04-08: qty 1

## 2021-04-08 MED ORDER — DIAZEPAM 2 MG PO TABS: 2.0000 mg | ORAL_TABLET | Freq: Four times a day (QID) | ORAL | Status: DC | PRN

## 2021-04-08 MED ORDER — SODIUM CHLORIDE 0.9 % IV SOLN: INTRAVENOUS | Status: AC

## 2021-04-08 MED ORDER — MECLIZINE HCL 25 MG PO TABS
25.0000 mg | ORAL_TABLET | Freq: Three times a day (TID) | ORAL | Status: DC
  Administered 2021-04-08 – 2021-04-09 (×4): 25 mg via ORAL
  Filled 2021-04-08 (×4): qty 1

## 2021-04-08 NOTE — ED Notes (Signed)
Patient placed on bedpan, urinated about 100 cc of clear urine.

## 2021-04-08 NOTE — ED Notes (Signed)
Pt experienced increased dizziness while sitting up to take pills. Stated the "room is spinning"

## 2021-04-08 NOTE — ED Notes (Signed)
Attempted to see if pt could stand without becoming dizzy. Pt was unable to sit on the side of the bed without becoming dizzy. Dr.Molpus notified.

## 2021-04-08 NOTE — Evaluation (Signed)
Physical Therapy Evaluation Patient Details Name: Carrie Drake MRN: [id number] DOB: 21-May-1946 Today's Date: 04/08/2021   History of Present Illness  75 yo female admitted with N/V and vertigo. Hx fo back pain, migraines, chronic R MCA infarct, spinal surgeries (cervical and lumbar), CAD, R RTC tear s/p repair 2014  Clinical Impression   Mobility: Min A for mobility. Only observed pt pivot to/from [facility] on today. High fall risk 2* dizziness. Discussed d/c plan-pt is not agreeable to placement. Will plan to follow and progress activity as safely able.   Vestibular: Observed horizontal nystagmus (L beating?) throughout session-it never ceased. Dizziness lasted ~30 sec-1 minute with positional changes and with supine head roll test. Will have therapist continue vestibular assessment on tomorrow.     Follow Up Recommendations Outpatient Rehab (vestibular specialist) if this will be feasible. Otherwise, Home health PT;Supervision/Assistance - 24 hour (pt declines placement)    Equipment Recommendations   (TBD)    Recommendations for Other Services       Precautions / Restrictions Precautions Precautions: Fall Restrictions Weight Bearing Restrictions: No      Mobility  Bed Mobility Overal bed mobility: Needs Assistance Bed Mobility: Rolling;Sidelying to Sit;Sit to Sidelying Rolling: Min guard Sidelying to sit: Min assist     Sit to sidelying: Min guard General bed mobility comments: A to steady 2* dizziness. Increased time.    Transfers Overall transfer level: Needs assistance   Transfers: Sit to/from Stand;Stand Pivot Transfers Sit to Stand: Min assist Stand pivot transfers: Min assist       General transfer comment: Very unsteady and at high risk for falls 2* dizziness.  Ambulation/Gait             General Gait Details: Deferred 2* dizziness/fall risk  Stairs            Wheelchair Mobility    Modified Rankin (Stroke Patients Only)        Balance Overall balance assessment: Needs assistance   Sitting balance-Leahy Scale: Good       Standing balance-Leahy Scale: Poor                               Pertinent Vitals/Pain Pain Assessment: No/denies pain    Home Living Family/patient expects to be discharged to:: Private residence Living Arrangements: Alone             Home Equipment: None      Prior Function Level of Independence: Independent               Hand Dominance        Extremity/Trunk Assessment   Upper Extremity Assessment Upper Extremity Assessment: Overall WFL for tasks assessed         Cervical / Trunk Assessment Cervical / Trunk Assessment: Normal  Communication   Communication: No difficulties  Cognition Arousal/Alertness: Awake/alert Behavior During Therapy: WFL for tasks assessed/performed Overall Cognitive Status: Within Functional Limits for tasks assessed                                        General Comments      Exercises     Assessment/Plan    PT Assessment Patient needs continued PT services  PT Problem List Decreased mobility;Decreased balance;Decreased activity tolerance;Decreased knowledge of use of DME       PT Treatment Interventions DME instruction;Gait  training;Therapeutic exercise;Balance training;Functional mobility training;Therapeutic activities;Patient/family education    PT Goals (Current goals can be found in the Care Plan section)  Acute Rehab PT Goals Patient Stated Goal: for dizziness to go away! PT Goal Formulation: With patient Time For Goal Achievement: 04/22/21 Potential to Achieve Goals: Good    Frequency Min 3X/week   Barriers to discharge Decreased caregiver support      Co-evaluation               AM-PAC PT "6 Clicks" Mobility  Outcome Measure Help needed turning from your back to your side while in a flat bed without using bedrails?: None Help needed moving from lying on your  back to sitting on the side of a flat bed without using bedrails?: A Little Help needed moving to and from a bed to a chair (including a wheelchair)?: A Little Help needed standing up from a chair using your arms (e.g., wheelchair or bedside chair)?: A Little Help needed to walk in hospital room?: A Lot Help needed climbing 3-5 steps with a railing? : Total 6 Click Score: 16    End of Session   Activity Tolerance:  (limited by dizziness) Patient left: in bed;with call bell/phone within reach;with bed alarm set   PT Visit Diagnosis: Unsteadiness on feet (R26.81);Dizziness and giddiness (R42);Difficulty in walking, not elsewhere classified (R26.2)    Time: 7510-2585 PT Time Calculation (min) (ACUTE ONLY): 22 min   Charges:   PT Evaluation $PT Eval Moderate Complexity: 1 Mod             Doreatha Massed, PT Acute Rehabilitation  Office: [phone] Pager: [phone]

## 2021-04-08 NOTE — Plan of Care (Signed)
  Problem: Safety: Goal: Ability to remain free from injury will improve Outcome: Progressing   Problem: Pain Managment: Goal: General experience of comfort will improve Outcome: Progressing   Problem: Coping: Goal: Level of anxiety will decrease Outcome: Progressing   

## 2021-04-08 NOTE — H&P (Addendum)
History and Physical    [name] HQP:[id number] DOB: [birth year] DOA: 04/07/2021  PCP: Berkley Harvey, NP   Patient coming from: Home   Chief Complaint: N/V, room-spinning sensation   HPI: Carrie Drake is a 75 y.o. female with medical history significant for hypertension, hypothyroidism, coronary artery disease, and GERD, who presented to the emergency department for evaluation of nausea, vomiting, and room spinning sensation.  The patient reports that she had been in her usual state of health and had an uneventful day yesterday until after eating dinner developed cute onset of nausea, and a sensation as though the room was spinning.  She reports having many episodes of nonbloody without abdominal pain fevers, or chills.  Symptoms are improved if she remains still with her eyes closed.  She has never experienced this previously.  She denies any headache or change in hearing.  She denies any focal numbness or weakness.  She was treated by EMS with Zofran prior to arrival in the ED.  ED Course: Upon arrival to the ED, patient is found to be afebrile, saturating well on room air, and with stable blood pressure.  Chemistry panel is unremarkable and CBC notable for leukocytosis to 15,600.  MRI brain is negative for acute findings but notable for chronic right MCA infarction.  Patient was treated with IV fluids, Benadryl, and Ativan in the emergency department but continues to have severe symptoms and is unable to sit up.  Review of Systems:  All other systems reviewed and apart from HPI, are negative.  Past Medical History:  Diagnosis Date  . Backache, unspecified   . 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 Complication of anesthesia    nausea & vomiting from Morphine  . Essential hypertension 02/27/2016  . GERD (gastroesophageal reflux disease)   . Hyperlipidemia 02/27/2016  . Hypothyroidism   . Lumbago 11/07/2013  . Migraine, unspecified, without mention of intractable  migraine without mention of status migrainosus   . Right rotator cuff tear   . Tobacco abuse 02/27/2016  . Unspecified disorder of skin and subcutaneous tissue     Past Surgical History:  Procedure Laterality Date  . BACK SURGERY  yrs ago   lower back surgery x 2  . cervical neck surgery for C 2 fx  1997   plate inserted, limited neck motion to left  . CHOLECYSTECTOMY  yrs ago  . COLONOSCOPY WITH PROPOFOL N/A 06/10/2014   Procedure: COLONOSCOPY WITH PROPOFOL;  Surgeon: Garlan Fair, MD;  Location: [facility];  Service: Endoscopy;  Laterality: N/A;  . COSMETIC SURGERY     FACE LIFT 2012  . ESOPHAGEAL MANOMETRY N/A 06/24/2014   Procedure: ESOPHAGEAL MANOMETRY (EM);  Surgeon: Garlan Fair, MD;  Location: [facility];  Service: Endoscopy;  Laterality: N/A;  . ESOPHAGOGASTRODUODENOSCOPY (EGD) WITH PROPOFOL N/A 06/10/2014   Procedure: ESOPHAGOGASTRODUODENOSCOPY (EGD) WITH PROPOFOL;  Surgeon: Garlan Fair, MD;  Location: [facility];  Service: Endoscopy;  Laterality: N/A;  . LAMINECTOMY    . left foot bunion removed  yrs ago  . SHOULDER OPEN ROTATOR CUFF REPAIR Right 04/11/2013   Procedure: RIGHT ROTATOR CUFF REPAIR SHOULDER OPEN, ACROMIONECTOMY w/ GRAFT;  Surgeon: Tobi Bastos, MD;  Location: [facility];  Service: Orthopedics;  Laterality: Right;  . TONSILLECTOMY      Social History:   reports that she has been smoking cigarettes. She has a 30.00 pack-year smoking history. She has never used smokeless tobacco. She reports current  alcohol use. She reports that she does not use drugs.  Allergies  Allergen Reactions  . Morphine And Related Nausea And Vomiting  . Sulfa Antibiotics Anaphylaxis  . Tetracycline Hcl Hives    Bull-hives  . Atorvastatin Other (See Comments)    [place] esophagus  . Isosorbide Other (See Comments)    dizziness  . Penicillins Hives  . Pravachol [Pravastatin Sodium]     Difficulty like with the Lipitor   . Tetracyclines & Related Hives     Bull-hives    Family History  Problem Relation Age of Onset  . Alzheimer's disease Mother   . Heart attack Mother   . Diabetes Father   . Stroke Father   . Hyperlipidemia Father   . Diabetes Brother   . Heart attack Brother   . Hyperlipidemia Brother   . Hyperlipidemia Sister      Prior to Admission medications   Medication Sig Start Date End Date Taking? Authorizing Provider  aspirin EC 81 MG tablet Take 81 mg by mouth daily.   Yes [provider]  Calcium-Magnesium-Vitamin D (CALCIUM 1200+D3 PO) Take by mouth daily.   Yes [provider]  diltiazem (CARDIZEM SR) 90 MG 12 hr capsule Take 90 mg by mouth 2 (two) times daily.   Yes [provider]  diphenhydrAMINE (SOMINEX) 25 MG tablet Take 25 mg by mouth at bedtime as needed for sleep.   Yes [provider]  ezetimibe (ZETIA) 10 MG tablet Take by mouth. 05/03/19  Yes [provider]  lansoprazole (PREVACID) 30 MG capsule Take 30 mg by mouth every evening.    Yes [provider]  levothyroxine (SYNTHROID, LEVOTHROID) 50 MCG tablet Take 50 mcg by mouth daily before breakfast.   Yes [provider]  lisinopril (ZESTRIL) 10 MG tablet Take 0.5 tablets by mouth daily. 02/20/21  Yes [provider]  Multiple Vitamins-Minerals (MULTI FOR HER 50+ PO) Take 1 tablet by mouth daily.   Yes [provider]    Physical Exam: Vitals:   04/08/21 0015 04/08/21 0030 04/08/21 0115 04/08/21 0333  BP: (!) 137/58 120/61 (!) 148/73 124/65  Pulse: 98 (!) 101 96 (!) 101  Resp: 18 16 16 16   Temp:      TempSrc:      SpO2: 95% 95% 93% 94%  Weight:      Height:        Constitutional: NAD, calm  Eyes: PERTLA, lids and conjunctivae normal ENMT: Mucous membranes are moist. Posterior pharynx clear of any exudate or lesions.   Neck: supple, no masses  Respiratory: clear to auscultation bilaterally, no wheezing, no crackles. No accessory muscle use.  Cardiovascular: S1 & S2  heard, regular rate and rhythm. No extremity edema.   Abdomen: No distension, no tenderness, soft. Bowel sounds active.  Musculoskeletal: no clubbing / cyanosis. No joint deformity upper and lower extremities.   Skin: no significant rashes, lesions, ulcers. Warm, dry, well-perfused. Neurologic: CN 2-12 grossly intact. Sensation intact. Strength 5/5 in all 4 limbs.  Psychiatric: Alert. Oriented to person, place, and situation.     Labs and Imaging on Admission: I have personally reviewed following labs and imaging studies  CBC: Recent Labs  Lab 04/07/21 2059  WBC 15.6*  HGB 15.2*  HCT 45.0  MCV 98.3  PLT 478   Basic Metabolic Panel: Recent Labs  Lab 04/07/21 2059  NA 139  K 3.5  CL 105  CO2 23  GLUCOSE 146*  BUN 16  CREATININE 0.95  CALCIUM 9.5   GFR: Estimated Creatinine Clearance: 39.4 mL/min (by C-G formula based on SCr of 0.95 mg/dL). Liver Function Tests: Recent Labs  Lab 04/07/21 2059  AST 20  ALT 21  ALKPHOS 51  BILITOT 0.5  PROT 7.4  ALBUMIN 4.3   Recent Labs  Lab 04/07/21 2059  LIPASE 26   No results for input(s): AMMONIA in the last 168 hours. Coagulation Profile: No results for input(s): INR, PROTIME in the last 168 hours. Cardiac Enzymes: No results for input(s): CKTOTAL, CKMB, CKMBINDEX, TROPONINI in the last 168 hours. BNP (last 3 results) No results for input(s): PROBNP in the last 8760 hours. HbA1C: No results for input(s): HGBA1C in the last 72 hours. CBG: No results for input(s): GLUCAP in the last 168 hours. Lipid Profile: No results for input(s): CHOL, HDL, LDLCALC, TRIG, CHOLHDL, LDLDIRECT in the last 72 hours. Thyroid Function Tests: No results for input(s): TSH, T4TOTAL, FREET4, T3FREE, THYROIDAB in the last 72 hours. Anemia Panel: No results for input(s): VITAMINB12, FOLATE, FERRITIN, TIBC, IRON, RETICCTPCT in the last 72 hours. Urine analysis:    Component Value Date/Time   COLORURINE YELLOW 04/08/2021 0440   APPEARANCEUR  HAZY (A) 04/08/2021 0440   LABSPEC 1.014 04/08/2021 0440   PHURINE 7.0 04/08/2021 0440   GLUCOSEU 50 (A) 04/08/2021 0440   HGBUR SMALL (A) 04/08/2021 0440   BILIRUBINUR NEGATIVE 04/08/2021 0440   KETONESUR 5 (A) 04/08/2021 0440   PROTEINUR NEGATIVE 04/08/2021 0440   UROBILINOGEN 0.2 04/05/2013 1031   NITRITE NEGATIVE 04/08/2021 0440   LEUKOCYTESUR NEGATIVE 04/08/2021 0440   Sepsis Labs: @LABRCNTIP (procalcitonin:4,lacticidven:4) )No results found for this or any previous visit (from the past 240 hour(s)).   Radiological Exams on Admission: MR BRAIN WO CONTRAST  Result Date: 04/08/2021 CLINICAL DATA:  Initial evaluation for acute dizziness. EXAM: MRI HEAD WITHOUT CONTRAST TECHNIQUE: Multiplanar, multiecho pulse sequences of the brain and surrounding structures were obtained without intravenous contrast. COMPARISON:  None available. FINDINGS: Brain: Generalized age-related cerebral atrophy. Few scattered patchy foci of subcentimeter T2/FLAIR hyperintensity involving the periventricular deep white matter both cerebral hemispheres noted, nonspecific, but most like related chronic microvascular ischemic disease, mild in nature. Focal area of encephalomalacia and gliosis at the right parieto-occipital region consistent with a chronic right MCA distribution infarct. Associated scattered chronic hemosiderin staining at this location. No abnormal foci of restricted diffusion to suggest acute or subacute ischemia. Gray-white matter differentiation otherwise maintained. No other areas of chronic cortical infarction. No other evidence for acute or chronic intracranial hemorrhage. No mass lesion, midline shift or mass effect. No hydrocephalus or extra-axial fluid collection. Pituitary gland suprasellar region normal. Midline structures intact. Tiny remote lacunar infarct noted at the anterior genu of the corpus callosum. Vascular: Major intracranial vascular flow voids are maintained. Skull and upper cervical  spine: Craniocervical junction within normal limits. Postsurgical changes partially visualize within the upper cervical spine. Bone marrow signal intensity normal. No scalp soft tissue abnormality. Sinuses/Orbits: Patient status post bilateral ocular lens replacement. Globes and orbital soft tissues demonstrate no acute finding. Mild scattered mucosal thickening noted within the ethmoidal air cells. Paranasal sinuses are otherwise largely clear. No mastoid effusion. Inner ear structures grossly normal. Other: None. IMPRESSION: 1. No acute intracranial abnormality. 2. Chronic posterior right MCA distribution infarct. 3. Underlying mild chronic microvascular ischemic disease. Electronically Signed   By: Jeannine Boga M.D.   On: 04/08/2021 00:36    Assessment/Plan   1. Vertigo  - Patient presents with N/V and sensation of room spinning  that began last night  - No acute findings on MRI brain  - Continues to have severe symptoms and is unable to sit up despite treatment with EMS and in ED  - Treat with meclizine, as-needed Zofran, Valium for refractory symptoms, consult PT    2. Leukocytosis  - WBC 15,600 without apparent infectious process  - Monitor, culture if febrile    3. Hypertension  - BP at goal, continue lisinopril and diltiazem    4. CAD - Based on CT  - Continue ASA, Zetia  5. Hx of CVA  - Continue ASA and Zetia   6. Hypothyroidism  - Continue Synthroid    DVT prophylaxis: Lovenox  Code Status: Full  Level of Care: Level of care: Med-Surg Family Communication: Sister at bedside Disposition Plan:  Patient is from: Home  Anticipated d/c is to: home  Anticipated d/c date is: 04/09/21 Patient currently: Unable to sit up d/t severe vertigo  Consults called: None  Admission status: Observation     Vianne Bulls, MD [facility]  04/08/2021, 5:22 AM

## 2021-04-08 NOTE — Progress Notes (Signed)
[facility] PROGRESS NOTE   NANCYE GRUMBINE GBT:[id number] DOB: [birth year] DOA: 04/07/2021  PCP: Berkley Harvey, NP  Brief History/Interval Summary: 75 y.o. female with medical history significant for hypertension, hypothyroidism, coronary artery disease, and GERD, who presented to the emergency department for evaluation of nausea, vomiting, and room spinning sensation.  The patient reports that she had been in her usual state of health and had an uneventful day until after eating dinner developed cute onset of nausea, and a sensation as though the room was spinning.  She reports having many episodes of nonbloody without abdominal pain fevers, or chills.  Symptoms are improved if she remains still with her eyes closed.  She has never experienced this previously.    Patient was evaluated in the ED.  Underwent MRI brain which did not show any acute process.  She was thought to have BPPV.  Could not be stabilized and for discharge.  Will need hospitalization due to safety reasons.    Reason for Visit: Vertigo  Consultants: None  Procedures: None  Antibiotics: Anti-infectives (From admission, onward)   None      Subjective/Interval History: Patient denies any ear pains.  No headaches.  As long as she is laying still she does not feel dizzy.  As soon as she moves she continues to have the sensation of the room spinning around her along with nausea.      Assessment/Plan:  Vertigo MRI brain did not show any acute findings.  An old infarct was noted in the right posterior MCA distribution.  Patient denies any ear complaints or ear pains at this time. Mild nystagmus is noted on examination. Patient started on meclizine.  We will change this to a scheduled dose for now.  Valium as needed.  Zofran for nausea.  PT and OT evaluation.  Leukocytosis Possibly reactive.  No obvious infection identified.  UA was unremarkable.  We will recheck tomorrow morning.  Essential  hypertension Continue lisinopril and diltiazem.  Hypothyroidism Continue levothyroxine.  History of coronary artery disease Apparently this is based on CT scan findings.  Continue aspirin and Zetia.  History of stroke based on MRI Patient not aware of stroke.  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  Otherwise she denies any episodes of focal weakness over the last year or so. Continue aspirin.  Noted to be anxiety.  Looks like she has had intolerance to atorvastatin and pravastatin previously.  DVT Prophylaxis: Lovenox Code Status: Full code Family Communication: Discussed with the patient Disposition Plan: Hopefully return home.  She mentions that she lives by herself.  She is trying to find family members who can help her.  Waiting on PT and OT evaluation.  Status is: Observation  The patient will require care spanning > 2 midnights and should be moved to inpatient because: Unsafe d/c plan and Inpatient level of care appropriate due to severity of illness  Dispo: The patient is from: Home              Anticipated d/c is to: Home              Patient currently is not medically stable to d/c.   Difficult to place patient No      Medications:  Scheduled: . aspirin EC  81 mg Oral Daily  . diltiazem  90 mg Oral BID  . enoxaparin (LOVENOX) injection  40 mg Subcutaneous Q24H  . ezetimibe  10 mg Oral Daily  . levothyroxine  50 mcg Oral Q0600  . lisinopril  5 mg Oral QHS  . pantoprazole  40 mg Oral Daily   Continuous: . sodium chloride 125 mL/hr at 04/08/21 0555   QQP:[id number] **OR** acetaminophen, diazepam, meclizine, ondansetron (ZOFRAN) IV, promethazine   Objective:  Vital Signs  Vitals:   04/08/21 0700 04/08/21 0745 04/08/21 0800 04/08/21 1000  BP: (!) 142/55  137/63 (!) 144/52  Pulse: 98 98 98 99  Resp: 15  16 17   Temp:      TempSrc:      SpO2: 95%  94% 95%  Weight:      Height:         Intake/Output Summary (Last 24 hours) at 04/08/2021 1100 Last data filed at 04/08/2021 0718 Gross per 24 hour  Intake 812.5 ml  Output --  Net 812.5 ml   Filed Weights   04/07/21 2049  Weight: 55.3 kg    General appearance: Awake alert.  In no distress Nystagmus noted horizontally. Resp: Clear to auscultation bilaterally.  Normal effort Cardio: S1-S2 is normal regular.  No S3-S4.  No rubs murmurs or bruit GI: Abdomen is soft.  Nontender nondistended.  Bowel sounds are present normal.  No masses organomegaly Extremities: No edema.  Full range of motion of lower extremities. Neurologic: Alert and oriented x3.  No focal neurological deficits.    Lab Results:  Data Reviewed: I have personally reviewed following labs and imaging studies  CBC: Recent Labs  Lab 04/07/21 2059  WBC 15.6*  HGB 15.2*  HCT 45.0  MCV 98.3  PLT 580    Basic Metabolic Panel: Recent Labs  Lab 04/07/21 2059  NA 139  K 3.5  CL 105  CO2 23  GLUCOSE 146*  BUN 16  CREATININE 0.95  CALCIUM 9.5    GFR: Estimated Creatinine Clearance: 39.4 mL/min (by C-G formula based on SCr of 0.95 mg/dL).  Liver Function Tests: Recent Labs  Lab 04/07/21 2059  AST 20  ALT 21  ALKPHOS 51  BILITOT 0.5  PROT 7.4  ALBUMIN 4.3    Recent Labs  Lab 04/07/21 2059  LIPASE 26     Recent Results (from the past 240 hour(s))  Resp Panel by RT-PCR (Flu A&B, Covid)     Status: None   Collection Time: 04/08/21  8:18 AM  Result Value Ref Range Status   SARS Coronavirus 2 by RT PCR NEGATIVE NEGATIVE Final    Comment: (NOTE) SARS-CoV-2 target nucleic acids are NOT DETECTED.  The SARS-CoV-2 RNA is generally detectable in upper respiratory specimens during the acute phase of infection. The lowest concentration of SARS-CoV-2 viral copies this assay can detect is 138 copies/mL. A negative result does not preclude SARS-Cov-2 infection and should not be used as the sole basis for treatment or other patient  management decisions. A negative result may occur with  improper specimen collection/handling, submission of specimen other than nasopharyngeal swab, presence of viral mutation(s) within the areas targeted by this assay, and inadequate number of viral copies(<138 copies/mL). A negative result must be combined with clinical observations, patient history, and epidemiological information. The expected result is Negative.  Fact Sheet for Patients:  EntrepreneurPulse.com.au  Fact Sheet for Healthcare Providers:  IncredibleEmployment.be  This test is no t yet approved or cleared by the [place] FDA and  has been authorized for detection and/or diagnosis of SARS-CoV-2 by FDA under an Emergency Use Authorization (EUA). This EUA will remain  in effect (meaning  this test can be used) for the duration of the COVID-19 declaration under Section 564(b)(1) of the Act, 21 U.S.C.section 360bbb-3(b)(1), unless the authorization is terminated  or revoked sooner.       Influenza A by PCR NEGATIVE NEGATIVE Final   Influenza B by PCR NEGATIVE NEGATIVE Final    Comment: (NOTE) The Xpert Xpress SARS-CoV-2/FLU/RSV plus assay is intended as an aid in the diagnosis of influenza from Nasopharyngeal swab specimens and should not be used as a sole basis for treatment. Nasal washings and aspirates are unacceptable for Xpert Xpress SARS-CoV-2/FLU/RSV testing.  Fact Sheet for Patients: EntrepreneurPulse.com.au  Fact Sheet for Healthcare Providers: IncredibleEmployment.be  This test is not yet approved or cleared by the [place] FDA and has been authorized for detection and/or diagnosis of SARS-CoV-2 by FDA under an Emergency Use Authorization (EUA). This EUA will remain in effect (meaning this test can be used) for the duration of the COVID-19 declaration under Section 564(b)(1) of the Act, 21 U.S.C. section 360bbb-3(b)(1),  unless the authorization is terminated or revoked.  Performed at [facility], [place] [address], [place], [place] [address]       Radiology Studies: MR BRAIN WO CONTRAST  Result Date: 04/08/2021 CLINICAL DATA:  Initial evaluation for acute dizziness. EXAM: MRI HEAD WITHOUT CONTRAST TECHNIQUE: Multiplanar, multiecho pulse sequences of the brain and surrounding structures were obtained without intravenous contrast. COMPARISON:  None available. FINDINGS: Brain: Generalized age-related cerebral atrophy. Few scattered patchy foci of subcentimeter T2/FLAIR hyperintensity involving the periventricular deep white matter both cerebral hemispheres noted, nonspecific, but most like related chronic microvascular ischemic disease, mild in nature. Focal area of encephalomalacia and gliosis at the right parieto-occipital region consistent with a chronic right MCA distribution infarct. Associated scattered chronic hemosiderin staining at this location. No abnormal foci of restricted diffusion to suggest acute or subacute ischemia. Gray-white matter differentiation otherwise maintained. No other areas of chronic cortical infarction. No other evidence for acute or chronic intracranial hemorrhage. No mass lesion, midline shift or mass effect. No hydrocephalus or extra-axial fluid collection. Pituitary gland suprasellar region normal. Midline structures intact. Tiny remote lacunar infarct noted at the anterior genu of the corpus callosum. Vascular: Major intracranial vascular flow voids are maintained. Skull and upper cervical spine: Craniocervical junction within normal limits. Postsurgical changes partially visualize within the upper cervical spine. Bone marrow signal intensity normal. No scalp soft tissue abnormality. Sinuses/Orbits: Patient status post bilateral ocular lens replacement. Globes and orbital soft tissues demonstrate no acute finding. Mild scattered mucosal thickening noted within the  ethmoidal air cells. Paranasal sinuses are otherwise largely clear. No mastoid effusion. Inner ear structures grossly normal. Other: None. IMPRESSION: 1. No acute intracranial abnormality. 2. Chronic posterior right MCA distribution infarct. 3. Underlying mild chronic microvascular ischemic disease. Electronically Signed   By: Jeannine Boga M.D.   On: 04/08/2021 00:36       LOS: 0 days   [facility] Pager on [email or web address]  04/08/2021, 11:00 AM

## 2021-04-08 NOTE — ED Notes (Signed)
Covid swab clicked off earlier, but not collected / lab did not receive specimen. Collected and sent to lab.

## 2021-04-08 NOTE — ED Provider Notes (Signed)
Nursing notes and vitals signs, including pulse oximetry, reviewed.  Summary of this visit's results, reviewed by myself:  EKG:  EKG Interpretation  Date/Time:    Ventricular Rate:    PR Interval:    QRS Duration:   QT Interval:    QTC Calculation:   R Axis:     Text Interpretation:         Labs:  Results for orders placed or performed during the hospital encounter of 04/07/21 (from the past 24 hour(s))  Lipase, blood     Status: None   Collection Time: 04/07/21  8:59 PM  Result Value Ref Range   Lipase 26 11 - 51 U/L  Comprehensive metabolic panel     Status: Abnormal   Collection Time: 04/07/21  8:59 PM  Result Value Ref Range   Sodium 139 135 - 145 mmol/L   Potassium 3.5 3.5 - 5.1 mmol/L   Chloride 105 98 - 111 mmol/L   CO2 23 22 - 32 mmol/L   Glucose, Bld 146 (H) 70 - 99 mg/dL   BUN 16 8 - 23 mg/dL   Creatinine, Ser 0.95 0.44 - 1.00 mg/dL   Calcium 9.5 8.9 - 10.3 mg/dL   Total Protein 7.4 6.5 - 8.1 g/dL   Albumin 4.3 3.5 - 5.0 g/dL   AST 20 15 - 41 U/L   ALT 21 0 - 44 U/L   Alkaline Phosphatase 51 38 - 126 U/L   Total Bilirubin 0.5 0.3 - 1.2 mg/dL   GFR, Estimated >60 >60 mL/min   Anion gap 11 5 - 15  CBC     Status: Abnormal   Collection Time: 04/07/21  8:59 PM  Result Value Ref Range   WBC 15.6 (H) 4.0 - 10.5 K/uL   RBC 4.58 3.87 - 5.11 MIL/uL   Hemoglobin 15.2 (H) 12.0 - 15.0 g/dL   HCT 45.0 36.0 - 46.0 %   MCV 98.3 80.0 - 100.0 fL   MCH 33.2 26.0 - 34.0 pg   MCHC 33.8 30.0 - 36.0 g/dL   RDW 13.1 11.5 - 15.5 %   Platelets 270 150 - 400 K/uL   nRBC 0.0 0.0 - 0.2 %    Imaging Studies: MR BRAIN WO CONTRAST  Result Date: 04/08/2021 CLINICAL DATA:  Initial evaluation for acute dizziness. EXAM: MRI HEAD WITHOUT CONTRAST TECHNIQUE: Multiplanar, multiecho pulse sequences of the brain and surrounding structures were obtained without intravenous contrast. COMPARISON:  None available. FINDINGS: Brain: Generalized age-related cerebral atrophy. Few scattered  patchy foci of subcentimeter T2/FLAIR hyperintensity involving the periventricular deep white matter both cerebral hemispheres noted, nonspecific, but most like related chronic microvascular ischemic disease, mild in nature. Focal area of encephalomalacia and gliosis at the right parieto-occipital region consistent with a chronic right MCA distribution infarct. Associated scattered chronic hemosiderin staining at this location. No abnormal foci of restricted diffusion to suggest acute or subacute ischemia. Gray-white matter differentiation otherwise maintained. No other areas of chronic cortical infarction. No other evidence for acute or chronic intracranial hemorrhage. No mass lesion, midline shift or mass effect. No hydrocephalus or extra-axial fluid collection. Pituitary gland suprasellar region normal. Midline structures intact. Tiny remote lacunar infarct noted at the anterior genu of the corpus callosum. Vascular: Major intracranial vascular flow voids are maintained. Skull and upper cervical spine: Craniocervical junction within normal limits. Postsurgical changes partially visualize within the upper cervical spine. Bone marrow signal intensity normal. No scalp soft tissue abnormality. Sinuses/Orbits: Patient status post bilateral ocular lens replacement. Globes and  orbital soft tissues demonstrate no acute finding. Mild scattered mucosal thickening noted within the ethmoidal air cells. Paranasal sinuses are otherwise largely clear. No mastoid effusion. Inner ear structures grossly normal. Other: None. IMPRESSION: 1. No acute intracranial abnormality. 2. Chronic posterior right MCA distribution infarct. 3. Underlying mild chronic microvascular ischemic disease. Electronically Signed   By: Jeannine Boga M.D.   On: 04/08/2021 00:36   1:26 AM The MCA distribution infarct seen on MRI is apparently old and chronic.  It is not likely to be the cause of her acute vertigo which Dr. Eulis Foster felt was more  likely peripheral.  The patient is still having vertiginous symptoms when moving her head, symptoms are improved when she lies still.  She did get 1 mg of Ativan earlier we will try IV Benadryl.  4:27 AM Patient still symptomatic and not even able to stand and attempt to ambulate.  We will have her admitted.  Dr. Myna Hidalgo to admit to hospitalist service.       Mykaila Blunck, Jenny Reichmann, MD 04/08/21 [phone]

## 2021-04-09 DIAGNOSIS — I10 Essential (primary) hypertension: Secondary | ICD-10-CM | POA: Diagnosis present

## 2021-04-09 DIAGNOSIS — Z7982 Long term (current) use of aspirin: Secondary | ICD-10-CM | POA: Diagnosis not present

## 2021-04-09 DIAGNOSIS — Z8349 Family history of other endocrine, nutritional and metabolic diseases: Secondary | ICD-10-CM | POA: Diagnosis not present

## 2021-04-09 DIAGNOSIS — K219 Gastro-esophageal reflux disease without esophagitis: Secondary | ICD-10-CM | POA: Diagnosis present

## 2021-04-09 DIAGNOSIS — Z882 Allergy status to sulfonamides status: Secondary | ICD-10-CM | POA: Diagnosis not present

## 2021-04-09 DIAGNOSIS — Z79890 Hormone replacement therapy (postmenopausal): Secondary | ICD-10-CM | POA: Diagnosis not present

## 2021-04-09 DIAGNOSIS — Z8249 Family history of ischemic heart disease and other diseases of the circulatory system: Secondary | ICD-10-CM | POA: Diagnosis not present

## 2021-04-09 DIAGNOSIS — I2510 Atherosclerotic heart disease of native coronary artery without angina pectoris: Secondary | ICD-10-CM | POA: Diagnosis present

## 2021-04-09 DIAGNOSIS — Z885 Allergy status to narcotic agent status: Secondary | ICD-10-CM | POA: Diagnosis not present

## 2021-04-09 DIAGNOSIS — Z888 Allergy status to other drugs, medicaments and biological substances status: Secondary | ICD-10-CM | POA: Diagnosis not present

## 2021-04-09 DIAGNOSIS — Z20822 Contact with and (suspected) exposure to covid-19: Secondary | ICD-10-CM | POA: Diagnosis present

## 2021-04-09 DIAGNOSIS — H8110 Benign paroxysmal vertigo, unspecified ear: Secondary | ICD-10-CM

## 2021-04-09 DIAGNOSIS — Z823 Family history of stroke: Secondary | ICD-10-CM | POA: Diagnosis not present

## 2021-04-09 DIAGNOSIS — Z820 Family history of epilepsy and other diseases of the nervous system: Secondary | ICD-10-CM | POA: Diagnosis not present

## 2021-04-09 DIAGNOSIS — D72829 Elevated white blood cell count, unspecified: Secondary | ICD-10-CM

## 2021-04-09 DIAGNOSIS — M75101 Unspecified rotator cuff tear or rupture of right shoulder, not specified as traumatic: Secondary | ICD-10-CM | POA: Diagnosis present

## 2021-04-09 DIAGNOSIS — Z8673 Personal history of transient ischemic attack (TIA), and cerebral infarction without residual deficits: Secondary | ICD-10-CM | POA: Diagnosis not present

## 2021-04-09 DIAGNOSIS — E039 Hypothyroidism, unspecified: Secondary | ICD-10-CM | POA: Diagnosis present

## 2021-04-09 DIAGNOSIS — Z881 Allergy status to other antibiotic agents status: Secondary | ICD-10-CM | POA: Diagnosis not present

## 2021-04-09 DIAGNOSIS — R42 Dizziness and giddiness: Secondary | ICD-10-CM | POA: Diagnosis present

## 2021-04-09 DIAGNOSIS — E785 Hyperlipidemia, unspecified: Secondary | ICD-10-CM | POA: Diagnosis present

## 2021-04-09 DIAGNOSIS — Z880 Allergy status to penicillin: Secondary | ICD-10-CM | POA: Diagnosis not present

## 2021-04-09 DIAGNOSIS — F17210 Nicotine dependence, cigarettes, uncomplicated: Secondary | ICD-10-CM | POA: Diagnosis present

## 2021-04-09 DIAGNOSIS — Z833 Family history of diabetes mellitus: Secondary | ICD-10-CM | POA: Diagnosis not present

## 2021-04-09 DIAGNOSIS — Z79899 Other long term (current) drug therapy: Secondary | ICD-10-CM | POA: Diagnosis not present

## 2021-04-09 LAB — BASIC METABOLIC PANEL
Anion gap: 8 (ref 5–15)
BUN: 12 mg/dL (ref 8–23)
CO2: 24 mmol/L (ref 22–32)
Calcium: 8.9 mg/dL (ref 8.9–10.3)
Chloride: 106 mmol/L (ref 98–111)
Creatinine, Ser: 0.76 mg/dL (ref 0.44–1.00)
GFR, Estimated: >60 mL/min (ref >60)
Glucose, Bld: 89 mg/dL (ref 70–99)
Potassium: 3.6 mmol/L (ref 3.5–5.1)
Sodium: 138 mmol/L (ref 135–145)

## 2021-04-09 LAB — CBC
HCT: 43 % (ref 36.0–46.0)
Hemoglobin: 14.1 g/dL (ref 12.0–15.0)
MCH: 33.1 pg (ref 26.0–34.0)
MCHC: 32.8 g/dL (ref 30.0–36.0)
MCV: 100.9 fL — ABNORMAL HIGH (ref 80.0–100.0)
Platelets: 221 10*3/uL (ref 150–400)
RBC: 4.26 MIL/uL (ref 3.87–5.11)
RDW: 13.1 % (ref 11.5–15.5)
WBC: 10.6 10*3/uL — ABNORMAL HIGH (ref 4.0–10.5)
nRBC: 0 % (ref 0.0–0.2)

## 2021-04-09 LAB — MAGNESIUM: Magnesium: 2.1 mg/dL (ref 1.7–2.4)

## 2021-04-09 MED ORDER — DIAZEPAM 2 MG PO TABS: 2.0000 mg | ORAL_TABLET | Freq: Three times a day (TID) | ORAL | 0 refills | Status: AC | PRN

## 2021-04-09 MED ORDER — MECLIZINE HCL 25 MG PO TABS: ORAL_TABLET | ORAL | 0 refills | Status: AC

## 2021-04-09 MED ORDER — ONDANSETRON HCL 4 MG PO TABS: 4.0000 mg | ORAL_TABLET | Freq: Three times a day (TID) | ORAL | 0 refills | Status: AC | PRN

## 2021-04-09 NOTE — Progress Notes (Signed)
Physical Therapy Treatment Patient Details Name: Carrie Drake MRN: [id number] DOB: 25-Feb-1946 Today's Date: 04/09/2021    History of Present Illness 75 yo female admitted with N/V and vertigo. Hx fo back pain, migraines, chronic R MCA infarct, spinal surgeries (cervical and lumbar), CAD, R RTC tear s/p repair 2014    PT Comments    Patient progressing gradually with mobility but remains limited by dizziness. She requires close min guard for safety with transfers and gait and balance is improved with use of RW and cues for gaze fixation with ambulation. Vestibular assessment indicates a 2nd degree Lt nystagmus and impaired VOR testing for Rt head rotation. 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 Pt will benefit from continued follow up for vestibular rehab in [facility] or OP setting. Pt provided with handout for gaze stabilization exercises and reviewed x1 exercises. She will require assist from family and supervision for all mobility due to significant balance impairments. Acute PT will continue to progress patient as able.    Follow Up Recommendations  Home health PT;Supervision/Assistance - 24 hour (vestibular rehab with HHPT or OPPT)     Equipment Recommendations  Rolling walker with 5" wheels    Recommendations for Other Services       Precautions / Restrictions Precautions Precautions: Fall Restrictions Weight Bearing Restrictions: No      Vestibular Assessment - 04/09/21 0001       Vestibular Assessment   General Observation pt resting in bed at start of session. slight Lt beating nystagmus present.      Symptom Behavior   Subjective history of current problem Pt symptoms began Tuesday. Experienced emesis and dizziness.    Type of Dizziness  Spinning;"World moves";Vertigo    Frequency of Dizziness constant    Duration of Dizziness improves after ~1 minute of being still    Symptom Nature Motion provoked;Constant    Aggravating Factors Spontaneous  onset;Turning head quickly;Turning head sideways;Walking in a crowd    Relieving Factors Closing eyes;Slow movements;Head stationary    Progression of Symptoms Better   still dizzy, no more emesis   History of similar episodes none known      Oculomotor Exam   Oculomotor Alignment Normal    Ocular ROM WNL    Spontaneous Left beating nystagmus    Gaze-induced  Left beating nystagmus with L gaze    Head shaking Horizontal L beating nystagmus    Head Shaking Vertical L beating nystagmus    Smooth Pursuits Intact    Saccades Intact      Vestibulo-Ocular Reflex   VOR Cancellation Unable to maintain gaze    Comment Pt with impaired VOR for Rt head turns. Intact with Lt head turns. Pt with likely Rt unilateral vestibular loss.      Cognition   Cognition Orientation Level Oriented x 4              Mobility  Bed Mobility Overal bed mobility: Needs Assistance Bed Mobility: Supine to Sit     Supine to sit: Min guard;Supervision          Transfers Overall transfer level: Needs assistance Equipment used: Rolling walker (2 wheeled) Transfers: Sit to/from Stand Sit to Stand: Min guard         General transfer comment: close guarding for safety due to dizziness. pt mildly unsteady with rising, balance improved with RW for support.  Ambulation/Gait Ambulation/Gait assistance: Min guard;Min assist Gait Distance (Feet): 160 Feet Assistive device: Rolling walker (2 wheeled);1 person hand held  assist Gait Pattern/deviations: Step-through pattern;Decreased stride length;Shuffle Gait velocity: decr   General Gait Details: cues for safe proximitiy to RW, and cues for gaze stabilization compensation strategy with gait. Pt stopping multiple times during gait due to unsteadiness to prevent LOB.   Stairs             Wheelchair Mobility    Modified Rankin (Stroke Patients Only)       Balance Overall balance assessment: Needs assistance   Sitting balance-Leahy Scale:  Good       Standing balance-Leahy Scale: Poor                              Cognition Arousal/Alertness: Awake/alert Behavior During Therapy: WFL for tasks assessed/performed Overall Cognitive Status: Within Functional Limits for tasks assessed                                        Exercises Other Exercises Other Exercises: Vestibular TE: gaze stabilization: x1 head nod and head rotation, 30 seconds each.    General Comments        Pertinent Vitals/Pain Pain Assessment: No/denies pain    Home Living                      Prior Function            PT Goals (current goals can now be found in the care plan section) Acute Rehab PT Goals Patient Stated Goal: for dizziness to go away! PT Goal Formulation: With patient Time For Goal Achievement: 04/22/21 Potential to Achieve Goals: Good Progress towards PT goals: Progressing toward goals    Frequency    Min 3X/week      PT Plan Current plan remains appropriate    Co-evaluation              AM-PAC PT "6 Clicks" Mobility   Outcome Measure  Help needed turning from your back to your side while in a flat bed without using bedrails?: None Help needed moving from lying on your back to sitting on the side of a flat bed without using bedrails?: A Little Help needed moving to and from a bed to a chair (including a wheelchair)?: A Little Help needed standing up from a chair using your arms (e.g., wheelchair or bedside chair)?: A Little Help needed to walk in hospital room?: A Little Help needed climbing 3-5 steps with a railing? : A Lot 6 Click Score: 18    End of Session Equipment Utilized During Treatment: Gait belt Activity Tolerance: Patient tolerated treatment well Patient left: in bed;with call bell/phone within reach;with family/visitor present Nurse Communication: Mobility status PT Visit Diagnosis: Unsteadiness on feet (R26.81);Dizziness and giddiness  (R42);Difficulty in walking, not elsewhere classified (R26.2)     Time: 2330-0762 (6 minutes spent obtaining HEP handout) PT Time Calculation (min) (ACUTE ONLY): 39 min  Charges:  $Gait Training: 8-22 mins $Physical Performance Test: 8-22 mins                     Carrie Drake, DPT Acute Rehabilitation Services Office [phone] Pager [phone]    Carrie Drake 04/09/2021, 6:17 PM

## 2021-04-09 NOTE — TOC Transition Note (Signed)
Transition of Care [facility]) - CM/SW Discharge Note  Patient Details  Name: Carrie Drake MRN: [id number] Date of Birth: 04-14-1946  [facility] CM/SW Contact:  Sherie Don, LCSW Phone Number: 04/09/2021, 3:25 PM  Clinical Narrative: PT evaluation recommended HHPT to address vestibular issues. Per Georgina Snell with [facility], they have PT available to address these issues. Patient is agreeable to referral to [facility]. HH orders are in. TOC signing off.  Final next level of care: Skilled Nursing Facility Barriers to Discharge: Barriers Resolved  Patient Goals and CMS Choice Patient states their goals for this hospitalization and ongoing recovery are:: Have vertigo improve CMS Medicare.gov Compare Post Acute Care list provided to:: Patient Choice offered to / list presented to : Patient  Discharge Plan and Services        DME Arranged: N/A DME Agency: NA HH Arranged: PT HH Agency: [place] Date [place]: 04/09/21 Representative spoke with at [place]: Georgina Snell  Readmission Risk Interventions No flowsheet data found.

## 2021-04-09 NOTE — Discharge Summary (Signed)
[facility]  Physician Discharge Summary   Patient ID: SHAKTHI SCIPIO MRN: [id number] DOB/AGE: [birth year] 75 y.o.  Admit date: 04/07/2021 Discharge date: 04/09/2021    PCP: Berkley Harvey, NP  DISCHARGE DIAGNOSES:  Vertigo Essential hypertension Hypothyroidism   RECOMMENDATIONS FOR OUTPATIENT FOLLOW UP: Patient instructed to follow-up with PCP and consider referral to ENT if there is no improvement in a few days.    Home Health: Home health PT Equipment/Devices: None  CODE STATUS: Full code  DISCHARGE CONDITION: fair  Diet recommendation: As before  INITIAL HISTORY: 75 y.o. female with medical history significant for hypertension, hypothyroidism, coronary artery disease, and GERD, who presented to the emergency department for evaluation of nausea, vomiting, and room spinning sensation.  The patient reports that she had been in her usual state of health and had an uneventful day until after eating dinner developed cute onset of nausea, and a sensation as though the room was spinning.  She reports having many episodes of nonbloody without abdominal pain fevers, or chills.  Symptoms are improved if she remains still with her eyes closed.  She has never experienced this previously.    Patient was evaluated in the ED.  Underwent MRI brain which did not show any acute process.  She was thought to have BPPV.  Could not be stabilized and for discharge.  Will need hospitalization due to safety reasons.     HOSPITAL COURSE:   Vertigo MRI brain did not show any acute findings.  An old infarct was noted in the right posterior MCA distribution.  Patient denies any ear complaints or ear pains at this time.  Patient was started on scheduled meclizine along with as needed Valium and Zofran.  She was slow to improve.  She was seen by physical therapy.  Nystagmus is better.  She is still unsteady.  However patient wants to go home.  Home health has been ordered.  Patient's sister will be  with her around-the-clock.  Follow-up with PCP next week with consideration of referral to ENT if she continues to have symptoms.   Essential hypertension Continue home medications    Hypothyroidism Continue levothyroxine.   History of coronary artery disease Apparently this is based on CT scan findings.  Continue aspirin and Zetia.   History of stroke based on MRI Patient not aware of stroke.  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  Otherwise she denies any episodes of focal weakness over the last year or so. Continue aspirin. Noted to be on Zetia.  Looks like she has had intolerance to atorvastatin and pravastatin previously.    Patient is stable.  Okay for discharge home.   PERTINENT LABS:  The results of significant diagnostics from this hospitalization (including imaging, microbiology, ancillary and laboratory) are listed below for reference.    Microbiology: Recent Results (from the past 240 hour(s))  Resp Panel by RT-PCR (Flu A&B, Covid)     Status: None   Collection Time: 04/08/21  8:18 AM  Result Value Ref Range Status   SARS Coronavirus 2 by RT PCR NEGATIVE NEGATIVE Final    Comment: (NOTE) SARS-CoV-2 target nucleic acids are NOT DETECTED.  The SARS-CoV-2 RNA is generally detectable in upper respiratory specimens during the acute phase of infection. The lowest concentration of SARS-CoV-2 viral copies this assay can detect is 138 copies/mL. A negative result does not preclude SARS-Cov-2 infection and should not be used as the sole basis  for treatment or other patient management decisions. A negative result may occur with  improper specimen collection/handling, submission of specimen other than nasopharyngeal swab, presence of viral mutation(s) within the areas targeted by this assay, and inadequate number of viral copies(<138 copies/mL). A negative result must be combined with clinical  observations, patient history, and epidemiological information. The expected result is Negative.  Fact Sheet for Patients:  EntrepreneurPulse.com.au  Fact Sheet for Healthcare Providers:  IncredibleEmployment.be  This test is no t yet approved or cleared by the [place] FDA and  has been authorized for detection and/or diagnosis of SARS-CoV-2 by FDA under an Emergency Use Authorization (EUA). This EUA will remain  in effect (meaning this test can be used) for the duration of the COVID-19 declaration under Section 564(b)(1) of the Act, 21 U.S.C.section 360bbb-3(b)(1), unless the authorization is terminated  or revoked sooner.       Influenza A by PCR NEGATIVE NEGATIVE Final   Influenza B by PCR NEGATIVE NEGATIVE Final    Comment: (NOTE) The Xpert Xpress SARS-CoV-2/FLU/RSV plus assay is intended as an aid in the diagnosis of influenza from Nasopharyngeal swab specimens and should not be used as a sole basis for treatment. Nasal washings and aspirates are unacceptable for Xpert Xpress SARS-CoV-2/FLU/RSV testing.  Fact Sheet for Patients: EntrepreneurPulse.com.au  Fact Sheet for Healthcare Providers: IncredibleEmployment.be  This test is not yet approved or cleared by the [place] FDA and has been authorized for detection and/or diagnosis of SARS-CoV-2 by FDA under an Emergency Use Authorization (EUA). This EUA will remain in effect (meaning this test can be used) for the duration of the COVID-19 declaration under Section 564(b)(1) of the Act, 21 U.S.C. section 360bbb-3(b)(1), unless the authorization is terminated or revoked.  Performed at [facility], [place] [address], [place], [place] [address]      Labs:  COVID-19 Labs   Lab Results  Component Value Date   [place] NEGATIVE 04/08/2021      Basic Metabolic Panel: Recent Labs  Lab 04/07/21 2059  04/09/21 0453  NA 139 138  K 3.5 3.6  CL 105 106  CO2 23 24  GLUCOSE 146* 89  BUN 16 12  CREATININE 0.95 0.76  CALCIUM 9.5 8.9  MG  --  2.1   Liver Function Tests: Recent Labs  Lab 04/07/21 2059  AST 20  ALT 21  ALKPHOS 51  BILITOT 0.5  PROT 7.4  ALBUMIN 4.3   Recent Labs  Lab 04/07/21 2059  LIPASE 26    CBC: Recent Labs  Lab 04/07/21 2059 04/09/21 0453  WBC 15.6* 10.6*  HGB 15.2* 14.1  HCT 45.0 43.0  MCV 98.3 100.9*  PLT 270 221     IMAGING STUDIES MR BRAIN WO CONTRAST  Result Date: 04/08/2021 CLINICAL DATA:  Initial evaluation for acute dizziness. EXAM: MRI HEAD WITHOUT CONTRAST TECHNIQUE: Multiplanar, multiecho pulse sequences of the brain and surrounding structures were obtained without intravenous contrast. COMPARISON:  None available. FINDINGS: Brain: Generalized age-related cerebral atrophy. Few scattered patchy foci of subcentimeter T2/FLAIR hyperintensity involving the periventricular deep white matter both cerebral hemispheres noted, nonspecific, but most like related chronic microvascular ischemic disease, mild in nature. Focal area of encephalomalacia and gliosis at the right parieto-occipital region consistent with a chronic right MCA distribution infarct. Associated scattered chronic hemosiderin staining at this location. No abnormal foci of restricted diffusion to suggest acute or subacute ischemia. Gray-white matter differentiation otherwise maintained. No other areas of chronic cortical infarction. No other evidence for  acute or chronic intracranial hemorrhage. No mass lesion, midline shift or mass effect. No hydrocephalus or extra-axial fluid collection. Pituitary gland suprasellar region normal. Midline structures intact. Tiny remote lacunar infarct noted at the anterior genu of the corpus callosum. Vascular: Major intracranial vascular flow voids are maintained. Skull and upper cervical spine: Craniocervical junction within normal limits. Postsurgical  changes partially visualize within the upper cervical spine. Bone marrow signal intensity normal. No scalp soft tissue abnormality. Sinuses/Orbits: Patient status post bilateral ocular lens replacement. Globes and orbital soft tissues demonstrate no acute finding. Mild scattered mucosal thickening noted within the ethmoidal air cells. Paranasal sinuses are otherwise largely clear. No mastoid effusion. Inner ear structures grossly normal. Other: None. IMPRESSION: 1. No acute intracranial abnormality. 2. Chronic posterior right MCA distribution infarct. 3. Underlying mild chronic microvascular ischemic disease. Electronically Signed   By: Jeannine Boga M.D.   On: 04/08/2021 00:36    DISCHARGE EXAMINATION: Vitals:   04/08/21 2122 04/09/21 0222 04/09/21 0618 04/09/21 1346  BP: (!) 151/64 (!) 127/57 (!) 107/53 126/66  Pulse: 82 71 65 72  Resp: 18 18 18 20   Temp: 98.6 F (37 C) 99.1 F (37.3 C) 98.4 F (36.9 C) 98.7 F (37.1 C)  TempSrc: Oral Oral Oral Oral  SpO2: 95% 93% 90% 96%  Weight:      Height:       General appearance: Awake alert.  In no distress Resp: Clear to auscultation bilaterally.  Normal effort Cardio: S1-S2 is normal regular.  No S3-S4.  No rubs murmurs or bruit GI: Abdomen is soft.  Nontender nondistended.  Bowel sounds are present normal.  No masses organomegaly    DISPOSITION: Home  Discharge Instructions     Call MD for:  difficulty breathing, headache or visual disturbances   Complete by: As directed    Call MD for:  extreme fatigue   Complete by: As directed    Call MD for:  persistant dizziness or light-headedness   Complete by: As directed    Call MD for:  persistant nausea and vomiting   Complete by: As directed    Call MD for:  severe uncontrolled pain   Complete by: As directed    Call MD for:  temperature >100.4   Complete by: As directed    Diet general   Complete by: As directed    Discharge instructions   Complete by: As directed    Please  take your medications as prescribed.  Follow-up with your PCP next week.  Please have them refer you to an ENT specialist with the vestibular clinic if your symptoms have not improved by then.  You were cared for by a hospitalist during your hospital stay. If you have any questions about your discharge medications or the care you received while you were in the hospital after you are discharged, you can call the unit and asked to speak with the hospitalist on call if the hospitalist that took care of you is not available. Once you are discharged, your primary care physician will handle any further medical issues. Please note that NO REFILLS for any discharge medications will be authorized once you are discharged, as it is imperative that you return to your primary care physician (or establish a relationship with a primary care physician if you do not have one) for your aftercare needs so that they can reassess your need for medications and monitor your lab values. If you do not have a primary care physician, you can call  [phone] for a physician referral.   Increase activity slowly   Complete by: As directed          Allergies as of 04/09/2021       Reactions   Morphine And Related Nausea And Vomiting   Sulfa Antibiotics Anaphylaxis   Tetracycline Hcl Hives   Bull-hives   Atorvastatin Other (See Comments)   [place] esophagus   Isosorbide Other (See Comments)   dizziness   Penicillins Hives   Pravachol [pravastatin Sodium]    Difficulty like with the Lipitor    Tetracyclines & Related Hives   Bull-hives        Medication List     TAKE these medications    aspirin EC 81 MG tablet Take 81 mg by mouth daily.   CALCIUM 1200+D3 PO Take by mouth daily.   diazepam 2 MG tablet Commonly known as: VALIUM Take 1 tablet (2 mg total) by mouth every 8 (eight) hours as needed (vertigo not responsive to meclizine).   diltiazem 90 MG 12 hr capsule Commonly known as: CARDIZEM SR Take 90  mg by mouth 2 (two) times daily.   diphenhydrAMINE 25 MG tablet Commonly known as: SOMINEX Take 25 mg by mouth at bedtime as needed for sleep.   ezetimibe 10 MG tablet Commonly known as: ZETIA Take by mouth.   lansoprazole 30 MG capsule Commonly known as: PREVACID Take 30 mg by mouth every evening.   levothyroxine 50 MCG tablet Commonly known as: SYNTHROID Take 50 mcg by mouth daily before breakfast.   lisinopril 10 MG tablet Commonly known as: ZESTRIL Take 0.5 tablets by mouth daily.   meclizine 25 MG tablet Commonly known as: ANTIVERT Take 1 tablet 3 times daily, 8 hours apart, for 2 days followed by 1 tablet 3 times daily as needed for vertigo.   MULTI FOR HER 50+ PO Take 1 tablet by mouth daily.   ondansetron 4 MG tablet Commonly known as: Zofran Take 1 tablet (4 mg total) by mouth every 8 (eight) hours as needed for nausea or vomiting.          Follow-up Information     Care, [facility] Follow up.   Specialty: [place] Why: PT Contact information: [address] [place] [address] [phone]         Berkley Harvey, NP. Schedule an appointment as soon as possible for a visit in 1 week(s).   Specialty: Nurse Practitioner Contact information: [place] [address] [phone]         Leta Baptist, MD Follow up.   Specialty: Otolaryngology Why: Call for appointment if not better by early next week. Ask your PCP to refer. Contact information: [place] [address] [phone]                 TOTAL DISCHARGE TIME: 35 minutes  [facility] Pager on [email or web address]  04/10/2021, 3:22 PM

## 2021-04-09 NOTE — Progress Notes (Signed)
[facility] PROGRESS NOTE   Carrie Drake WIO:[id number] DOB: [birth year] DOA: 04/07/2021  PCP: Berkley Harvey, NP  Brief History/Interval Summary: 75 y.o. female with medical history significant for hypertension, hypothyroidism, coronary artery disease, and GERD, who presented to the emergency department for evaluation of nausea, vomiting, and room spinning sensation.  The patient reports that she had been in her usual state of health and had an uneventful day until after eating dinner developed cute onset of nausea, and a sensation as though the room was spinning.  She reports having many episodes of nonbloody without abdominal pain fevers, or chills.  Symptoms are improved if she remains still with her eyes closed.  She has never experienced this previously.    Patient was evaluated in the ED.  Underwent MRI brain which did not show any acute process.  She was thought to have BPPV.  Could not be stabilized and for discharge.  Will need hospitalization due to safety reasons.    Reason for Visit: Vertigo  Consultants: None  Procedures: None  Antibiotics: Anti-infectives (From admission, onward)    None       Subjective/Interval History: Patient mentions that she is feeling slightly better today compared to yesterday.  No longer having the spinning sensations.  Continues to have poor appetite.       Assessment/Plan:  Vertigo MRI brain did not show any acute findings.  An old infarct was noted in the right posterior MCA distribution.  Patient denies any ear complaints or ear pains at this time. Improvement in nystagmus noted.  Continue scheduled meclizine.  She was seen by physical therapy yesterday but did not ambulate her.  She is feeling better this morning.  Will need a reevaluation by physical therapy.  Home health will be ordered.   Possible discharge later today once she has ambulated without which difficulty.  She has a sister who will help her at home. Outpatient  follow-up with the PCP.  If this remains a persistent issue then referral may need to be made to ENT.  Leukocytosis Possibly reactive.  No obvious infection identified.  UA was unremarkable.  WBC noted to be better today.    Essential hypertension Continue lisinopril and diltiazem.  Blood pressure is reasonably well controlled.  Hypothyroidism Continue levothyroxine.  History of coronary artery disease Apparently this is based on CT scan findings.  Continue aspirin and Zetia.  History of stroke based on MRI Patient not aware of stroke.  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  Otherwise she denies any episodes of focal weakness over the last year or so. Continue aspirin.  Noted to be on Zetia.  Looks like she has had intolerance to atorvastatin and pravastatin previously.  DVT Prophylaxis: Lovenox Code Status: Full code Family Communication: Discussed with the patient Disposition Plan: Hopefully return home later today once reevaluated by PT.    Status is: Observation  The patient will require care spanning > 2 midnights and should be moved to inpatient because: Unsafe d/c plan and Inpatient level of care appropriate due to severity of illness  Dispo: The patient is from: Home              Anticipated d/c is to: Home              Patient currently is not medically stable to d/c.   Difficult to place patient No      Medications:  Scheduled:  aspirin EC  81 mg Oral Daily   diltiazem  90 mg Oral BID   enoxaparin (LOVENOX) injection  40 mg Subcutaneous Q24H   ezetimibe  10 mg Oral Daily   levothyroxine  50 mcg Oral Q0600   lisinopril  5 mg Oral QHS   meclizine  25 mg Oral TID   pantoprazole  40 mg Oral Daily   Continuous:   [id number] **OR** acetaminophen, diazepam, ondansetron (ZOFRAN) IV, promethazine   Objective:  Vital Signs  Vitals:   04/08/21 1739 04/08/21 2122 04/09/21 0222  04/09/21 0618  BP: (!) 153/64 (!) 151/64 (!) 127/57 (!) 107/53  Pulse: 92 82 71 65  Resp: 18 18 18 18   Temp: 98.7 F (37.1 C) 98.6 F (37 C) 99.1 F (37.3 C) 98.4 F (36.9 C)  TempSrc: Oral Oral Oral Oral  SpO2: 96% 95% 93% 90%  Weight:      Height:        Intake/Output Summary (Last 24 hours) at 04/09/2021 1121 Last data filed at 04/09/2021 0600 Gross per 24 hour  Intake 1557.33 ml  Output 1900 ml  Net -342.67 ml    Filed Weights   04/07/21 2049  Weight: 55.3 kg    General appearance: Awake alert.  In no distress Improvement in horizontal nystagmus Resp: Clear to auscultation bilaterally.  Normal effort Cardio: S1-S2 is normal regular.  No S3-S4.  No rubs murmurs or bruit GI: Abdomen is soft.  Nontender nondistended.  Bowel sounds are present normal.  No masses organomegaly Extremities: No edema.  Full range of motion of lower extremities. Neurologic: Alert and oriented x3.  No focal neurological deficits.     Lab Results:  Data Reviewed: I have personally reviewed following labs and imaging studies  CBC: Recent Labs  Lab 04/07/21 2059 04/09/21 0453  WBC 15.6* 10.6*  HGB 15.2* 14.1  HCT 45.0 43.0  MCV 98.3 100.9*  PLT 270 221     Basic Metabolic Panel: Recent Labs  Lab 04/07/21 2059 04/09/21 0453  NA 139 138  K 3.5 3.6  CL 105 106  CO2 23 24  GLUCOSE 146* 89  BUN 16 12  CREATININE 0.95 0.76  CALCIUM 9.5 8.9  MG  --  2.1     GFR: Estimated Creatinine Clearance: 46.8 mL/min (by C-G formula based on SCr of 0.76 mg/dL).  Liver Function Tests: Recent Labs  Lab 04/07/21 2059  AST 20  ALT 21  ALKPHOS 51  BILITOT 0.5  PROT 7.4  ALBUMIN 4.3     Recent Labs  Lab 04/07/21 2059  LIPASE 26      Recent Results (from the past 240 hour(s))  Resp Panel by RT-PCR (Flu A&B, Covid)     Status: None   Collection Time: 04/08/21  8:18 AM  Result Value Ref Range Status   SARS Coronavirus 2 by RT PCR NEGATIVE NEGATIVE Final    Comment:  (NOTE) SARS-CoV-2 target nucleic acids are NOT DETECTED.  The SARS-CoV-2 RNA is generally detectable in upper respiratory specimens during the acute phase of infection. The lowest concentration of SARS-CoV-2 viral copies this assay can detect is 138 copies/mL. A negative result does not preclude SARS-Cov-2 infection and should not be used as the sole basis for treatment or other patient management decisions. A negative result may occur with  improper specimen collection/handling, submission of specimen other than nasopharyngeal swab, presence of viral mutation(s) within the areas targeted by this assay, and inadequate number of viral copies(<138 copies/mL). A  negative result must be combined with clinical observations, patient history, and epidemiological information. The expected result is Negative.  Fact Sheet for Patients:  EntrepreneurPulse.com.au  Fact Sheet for Healthcare Providers:  IncredibleEmployment.be  This test is no t yet approved or cleared by the [place] FDA and  has been authorized for detection and/or diagnosis of SARS-CoV-2 by FDA under an Emergency Use Authorization (EUA). This EUA will remain  in effect (meaning this test can be used) for the duration of the COVID-19 declaration under Section 564(b)(1) of the Act, 21 U.S.C.section 360bbb-3(b)(1), unless the authorization is terminated  or revoked sooner.       Influenza A by PCR NEGATIVE NEGATIVE Final   Influenza B by PCR NEGATIVE NEGATIVE Final    Comment: (NOTE) The Xpert Xpress SARS-CoV-2/FLU/RSV plus assay is intended as an aid in the diagnosis of influenza from Nasopharyngeal swab specimens and should not be used as a sole basis for treatment. Nasal washings and aspirates are unacceptable for Xpert Xpress SARS-CoV-2/FLU/RSV testing.  Fact Sheet for Patients: EntrepreneurPulse.com.au  Fact Sheet for Healthcare  Providers: IncredibleEmployment.be  This test is not yet approved or cleared by the [place] FDA and has been authorized for detection and/or diagnosis of SARS-CoV-2 by FDA under an Emergency Use Authorization (EUA). This EUA will remain in effect (meaning this test can be used) for the duration of the COVID-19 declaration under Section 564(b)(1) of the Act, 21 U.S.C. section 360bbb-3(b)(1), unless the authorization is terminated or revoked.  Performed at [facility], [place] [address], [place], [place] [address]       Radiology Studies: MR BRAIN WO CONTRAST  Result Date: 04/08/2021 CLINICAL DATA:  Initial evaluation for acute dizziness. EXAM: MRI HEAD WITHOUT CONTRAST TECHNIQUE: Multiplanar, multiecho pulse sequences of the brain and surrounding structures were obtained without intravenous contrast. COMPARISON:  None available. FINDINGS: Brain: Generalized age-related cerebral atrophy. Few scattered patchy foci of subcentimeter T2/FLAIR hyperintensity involving the periventricular deep white matter both cerebral hemispheres noted, nonspecific, but most like related chronic microvascular ischemic disease, mild in nature. Focal area of encephalomalacia and gliosis at the right parieto-occipital region consistent with a chronic right MCA distribution infarct. Associated scattered chronic hemosiderin staining at this location. No abnormal foci of restricted diffusion to suggest acute or subacute ischemia. Gray-white matter differentiation otherwise maintained. No other areas of chronic cortical infarction. No other evidence for acute or chronic intracranial hemorrhage. No mass lesion, midline shift or mass effect. No hydrocephalus or extra-axial fluid collection. Pituitary gland suprasellar region normal. Midline structures intact. Tiny remote lacunar infarct noted at the anterior genu of the corpus callosum. Vascular: Major intracranial vascular flow voids are  maintained. Skull and upper cervical spine: Craniocervical junction within normal limits. Postsurgical changes partially visualize within the upper cervical spine. Bone marrow signal intensity normal. No scalp soft tissue abnormality. Sinuses/Orbits: Patient status post bilateral ocular lens replacement. Globes and orbital soft tissues demonstrate no acute finding. Mild scattered mucosal thickening noted within the ethmoidal air cells. Paranasal sinuses are otherwise largely clear. No mastoid effusion. Inner ear structures grossly normal. Other: None. IMPRESSION: 1. No acute intracranial abnormality. 2. Chronic posterior right MCA distribution infarct. 3. Underlying mild chronic microvascular ischemic disease. Electronically Signed   By: Jeannine Boga M.D.   On: 04/08/2021 00:36        LOS: 0 days   [facility] Pager on [email or web address]  04/09/2021, 11:21 AM

## 2021-04-09 NOTE — Progress Notes (Signed)
Patient was given discharge instructions, and all questions were answered.  Patient was stable for discharge and was taken to the main exit by wheelchair. 

## 2021-04-17 ENCOUNTER — Other Ambulatory Visit: Payer: Self-pay | Admitting: Nurse Practitioner

## 2021-04-17 DIAGNOSIS — I7090 Unspecified atherosclerosis: Secondary | ICD-10-CM

## 2021-04-17 DIAGNOSIS — R42 Dizziness and giddiness: Secondary | ICD-10-CM

## 2021-04-21 ENCOUNTER — Telehealth: Payer: Self-pay | Admitting: *Deleted

## 2021-04-21 DIAGNOSIS — Z006 Encounter for examination for normal comparison and control in clinical research program: Secondary | ICD-10-CM

## 2021-04-21 NOTE — Telephone Encounter (Signed)
I called patient for end of study visit for Clear Study.  Patient is doing well after recent hospitalization for vertigo. Adverse event and medications assessed. I thanked patient for doing study. I will call her in 30 day post-study.

## 2021-04-29 ENCOUNTER — Ambulatory Visit
Admission: RE | Admit: 2021-04-29 | Discharge: 2021-04-29 | Disposition: A | Discharge status: Home / Self Care | Payer: Medicare Other | Source: Ambulatory Visit | Attending: Nurse Practitioner | Admitting: Nurse Practitioner

## 2021-04-29 DIAGNOSIS — R42 Dizziness and giddiness: Secondary | ICD-10-CM

## 2021-04-29 DIAGNOSIS — I7090 Unspecified atherosclerosis: Secondary | ICD-10-CM

## 2021-05-12 ENCOUNTER — Telehealth: Payer: Self-pay | Admitting: *Deleted

## 2021-05-12 NOTE — Telephone Encounter (Signed)
Just completed the post end of study phone call/chart review for the CLEAR research study. All of subject's concomitant medications have been reviewed and updated in the [facility] system if applicable. There are no new AE's or SAE's to report to sponsor.

## 2021-07-13 IMAGING — US US CAROTID DUPLEX BILAT
1 series · 13 of 24 positions shown · non-contrast
Comparison: None.

CLINICAL DATA: Vertigo, carotid atherosclerosis, dizziness

EXAM:
BILATERAL CAROTID DUPLEX ULTRASOUND
TECHNIQUE: Gray scale imaging, color Doppler and duplex ultrasound were
performed of bilateral carotid and vertebral arteries in the neck.

[Series 1: us carotid duplex bilat · 0.06mm/px · 13 of 86 slices shown]
[im 1/86]
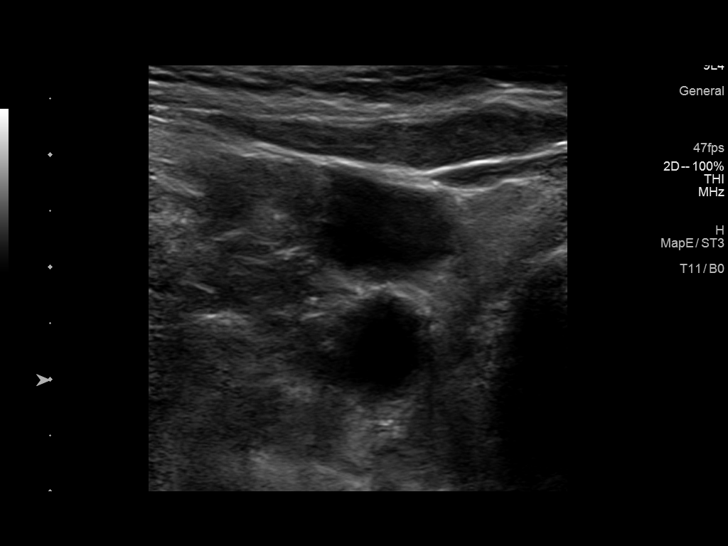
[im 8/86]
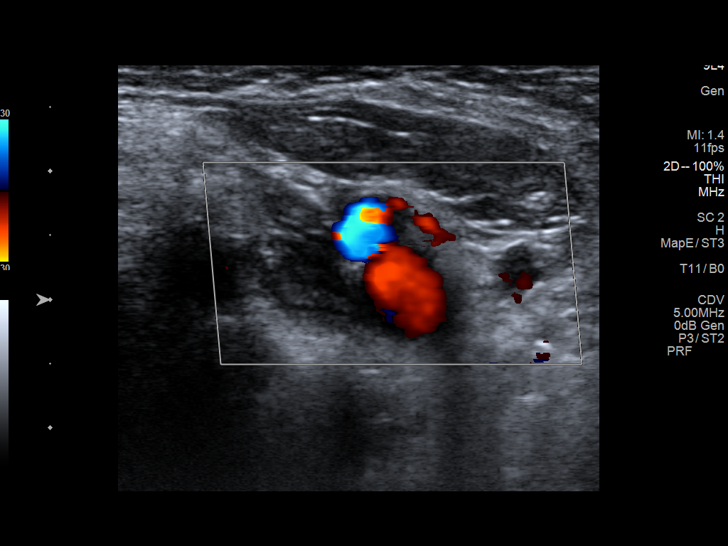
[im 15/86]
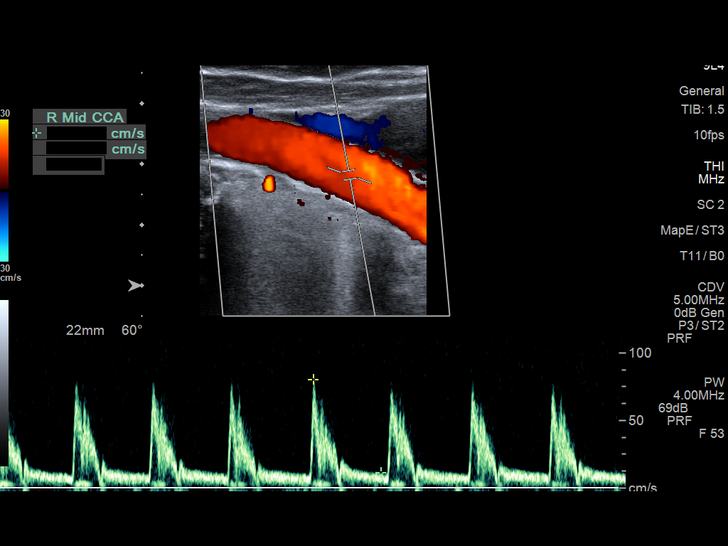
[im 23/86]
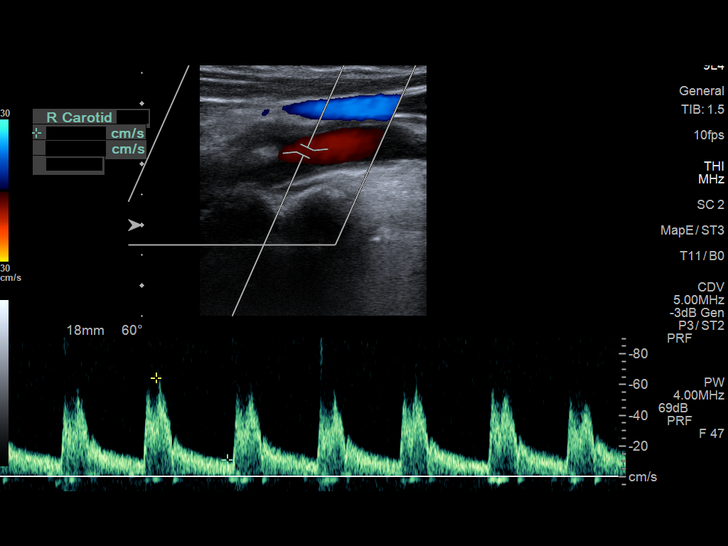
[im 30/86]
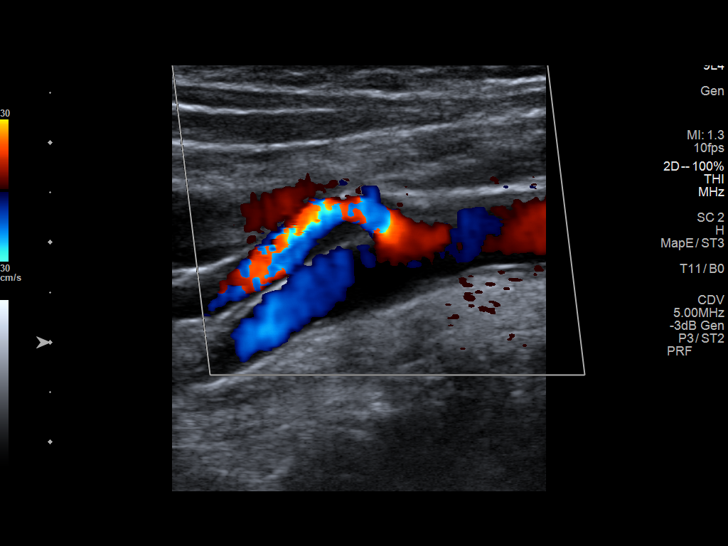
[im 37/86]
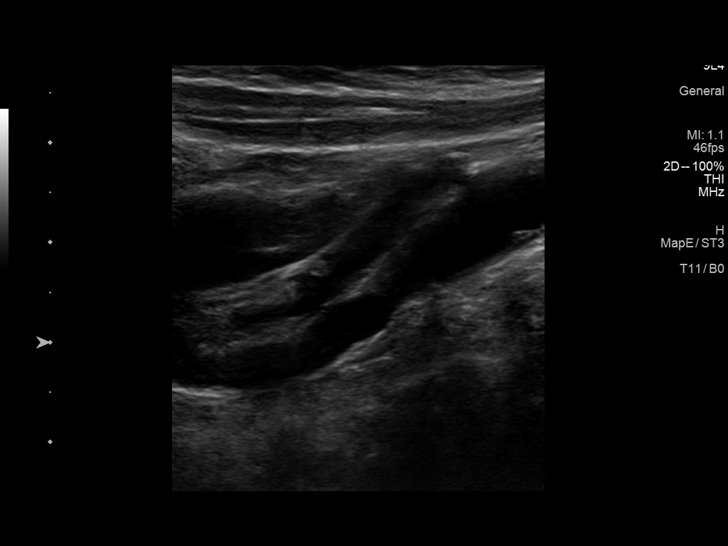
[im 45/86]
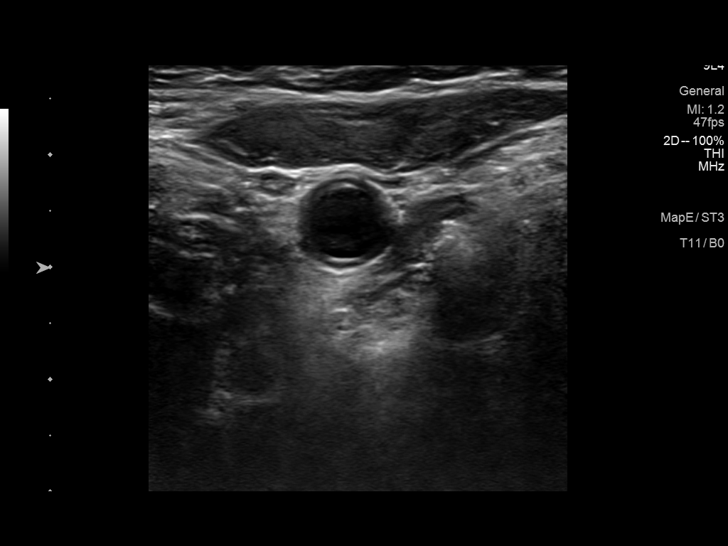
[im 49/86]
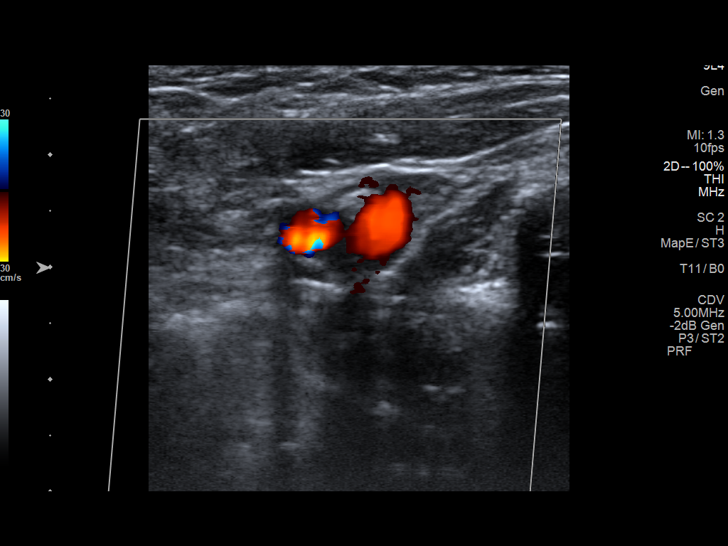
[im 56/86]
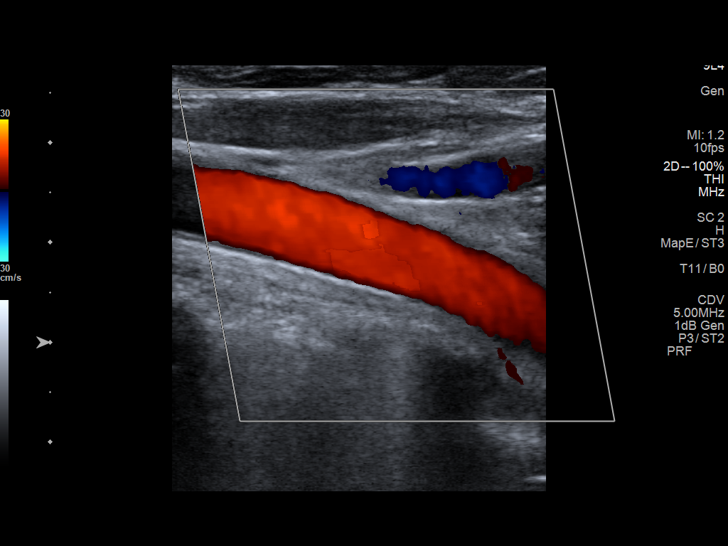
[im 63/86]
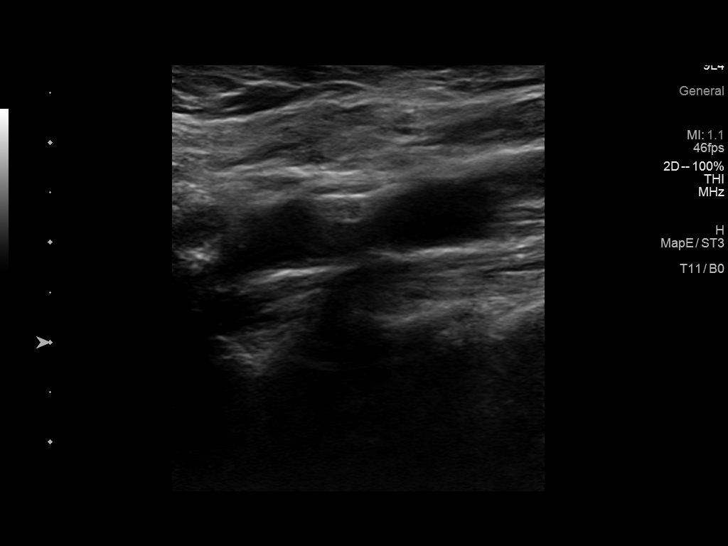
[im 71/86]
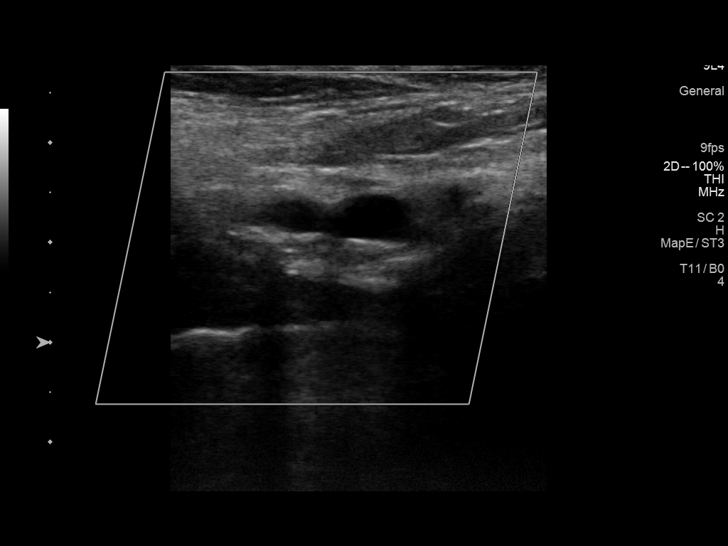
[im 78/86]
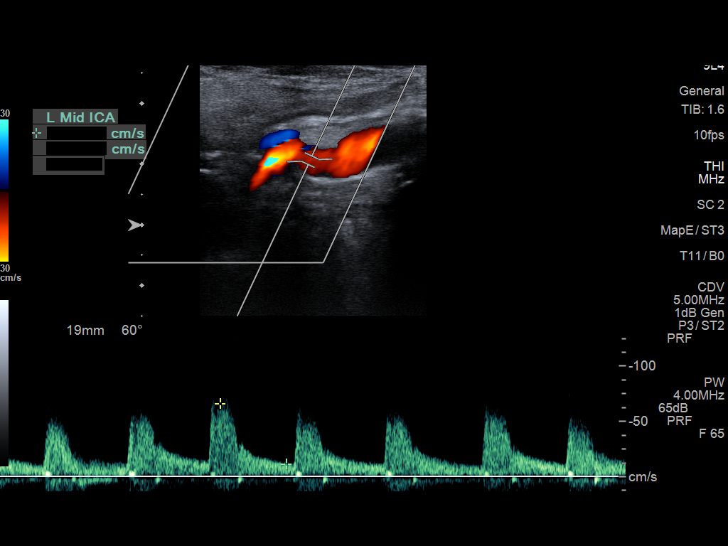
[im 86/86]
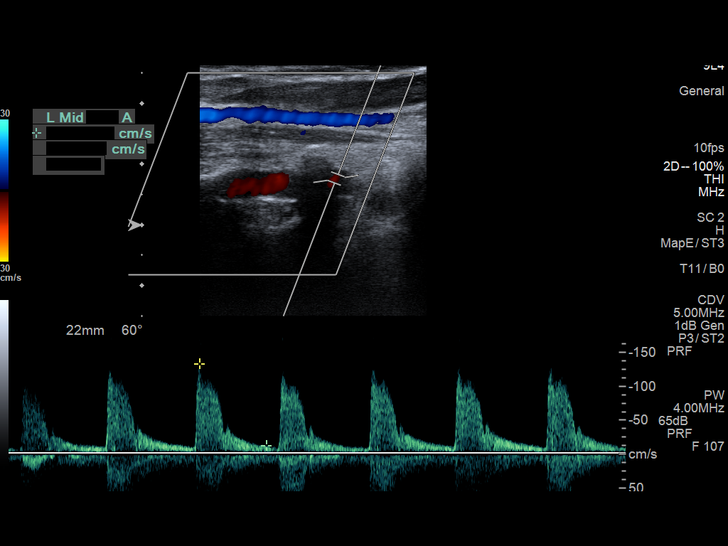

[13 of 24 positions shown; findings below may reference images not displayed]

FINDINGS: Criteria: Quantification of carotid stenosis is based on velocity
parameters that correlate the residual internal carotid diameter
with NASCET-based stenosis levels, using the diameter of the distal
internal carotid lumen as the denominator for stenosis measurement.

The following velocity measurements were obtained:

RIGHT

ICA: 84/15 cm/sec

CCA: 97/8 cm/sec

SYSTOLIC ICA/CCA RATIO:

ECA: 179 cm/sec

LEFT

ICA: 103/17 cm/sec

CCA: 73/12 cm/sec

SYSTOLIC ICA/CCA RATIO:

ECA: 176/9 cm/sec

RIGHT CAROTID ARTERY: Minor echogenic shadowing plaque formation. No
hemodynamically significant right ICA stenosis, velocity elevation,
or turbulent flow. Degree of narrowing less than 50%.

RIGHT VERTEBRAL ARTERY:  Normal antegrade flow

LEFT CAROTID ARTERY: Similar scattered minor echogenic plaque
formation. No hemodynamically significant left ICA stenosis,
velocity elevation, or turbulent flow.

LEFT VERTEBRAL ARTERY:  Normal antegrade flow

Upper extremity blood pressures: RIGHT: 145/66 LEFT: 152/64
IMPRESSION: Minor carotid atherosclerosis. No significant ICA stenosis. Degree
of narrowing less than 50% bilaterally by ultrasound criteria.

Patent antegrade vertebral flow bilaterally

## 2021-12-30 DEATH — deceased
